# Patient Record
Sex: Female | Born: 1950
Health system: Southern US, Community
[De-identification: ages and names within clinical notes are randomized; demographics above are authoritative.]

## PROBLEM LIST (undated history)

## (undated) DIAGNOSIS — H8109 Meniere's disease, unspecified ear: Secondary | ICD-10-CM

## (undated) DIAGNOSIS — I10 Essential (primary) hypertension: Secondary | ICD-10-CM

## (undated) DIAGNOSIS — M1990 Unspecified osteoarthritis, unspecified site: Secondary | ICD-10-CM

## (undated) DIAGNOSIS — J302 Other seasonal allergic rhinitis: Secondary | ICD-10-CM

## (undated) DIAGNOSIS — F17200 Nicotine dependence, unspecified, uncomplicated: Secondary | ICD-10-CM

## (undated) HISTORY — PX: CARPAL TUNNEL RELEASE: SHX101

## (undated) HISTORY — PX: MIDDLE EAR SURGERY: SHX713

## (undated) HISTORY — PX: TRIGGER FINGER RELEASE: SHX641

## (undated) HISTORY — PX: TUBAL LIGATION: SHX77

## (undated) HISTORY — PX: ABDOMINAL HYSTERECTOMY: SHX81

---

## 1999-08-19 ENCOUNTER — Other Ambulatory Visit: Admission: RE | Admit: 1999-08-19 | Discharge: 1999-08-19 | Payer: Self-pay | Admitting: Obstetrics and Gynecology

## 1999-09-30 ENCOUNTER — Encounter: Admission: RE | Admit: 1999-09-30 | Discharge: 1999-09-30 | Payer: Self-pay | Admitting: Obstetrics and Gynecology

## 2000-10-03 ENCOUNTER — Encounter: Admission: RE | Admit: 2000-10-03 | Discharge: 2000-10-03 | Payer: Self-pay | Admitting: Obstetrics and Gynecology

## 2000-10-03 ENCOUNTER — Encounter: Payer: Self-pay | Admitting: Obstetrics and Gynecology

## 2001-07-04 ENCOUNTER — Other Ambulatory Visit: Admission: RE | Admit: 2001-07-04 | Discharge: 2001-07-04 | Payer: Self-pay | Admitting: Obstetrics and Gynecology

## 2001-07-13 ENCOUNTER — Encounter (INDEPENDENT_AMBULATORY_CARE_PROVIDER_SITE_OTHER): Payer: Self-pay

## 2001-07-13 ENCOUNTER — Other Ambulatory Visit: Admission: RE | Admit: 2001-07-13 | Discharge: 2001-07-13 | Payer: Self-pay | Admitting: Obstetrics and Gynecology

## 2001-10-04 ENCOUNTER — Encounter: Payer: Self-pay | Admitting: Obstetrics and Gynecology

## 2001-10-04 ENCOUNTER — Encounter: Admission: RE | Admit: 2001-10-04 | Discharge: 2001-10-04 | Payer: Self-pay | Admitting: Obstetrics and Gynecology

## 2002-10-17 ENCOUNTER — Encounter: Payer: Self-pay | Admitting: Obstetrics and Gynecology

## 2002-10-17 ENCOUNTER — Encounter: Admission: RE | Admit: 2002-10-17 | Discharge: 2002-10-17 | Payer: Self-pay | Admitting: Obstetrics and Gynecology

## 2003-11-19 ENCOUNTER — Encounter: Admission: RE | Admit: 2003-11-19 | Discharge: 2003-11-19 | Payer: Self-pay | Admitting: Obstetrics and Gynecology

## 2004-12-16 ENCOUNTER — Encounter: Admission: RE | Admit: 2004-12-16 | Discharge: 2004-12-16 | Payer: Self-pay | Admitting: Obstetrics and Gynecology

## 2005-01-06 ENCOUNTER — Encounter: Admission: RE | Admit: 2005-01-06 | Discharge: 2005-01-06 | Payer: Self-pay | Admitting: Obstetrics and Gynecology

## 2005-11-02 ENCOUNTER — Encounter: Admission: RE | Admit: 2005-11-02 | Discharge: 2005-11-02 | Payer: Self-pay | Admitting: Specialist

## 2006-01-19 ENCOUNTER — Encounter: Admission: RE | Admit: 2006-01-19 | Discharge: 2006-01-19 | Payer: Self-pay | Admitting: Family Medicine

## 2007-03-01 ENCOUNTER — Encounter: Admission: RE | Admit: 2007-03-01 | Discharge: 2007-03-01 | Payer: Self-pay | Admitting: Otolaryngology

## 2007-08-30 ENCOUNTER — Ambulatory Visit (HOSPITAL_COMMUNITY): Admission: RE | Admit: 2007-08-30 | Discharge: 2007-08-30 | Payer: Self-pay | Admitting: Obstetrics and Gynecology

## 2007-08-30 ENCOUNTER — Encounter (INDEPENDENT_AMBULATORY_CARE_PROVIDER_SITE_OTHER): Payer: Self-pay | Admitting: Obstetrics and Gynecology

## 2009-06-09 ENCOUNTER — Encounter: Admission: RE | Admit: 2009-06-09 | Discharge: 2009-06-09 | Payer: Self-pay | Admitting: Obstetrics and Gynecology

## 2009-10-22 ENCOUNTER — Encounter (INDEPENDENT_AMBULATORY_CARE_PROVIDER_SITE_OTHER): Payer: Self-pay | Admitting: Obstetrics and Gynecology

## 2009-10-22 ENCOUNTER — Inpatient Hospital Stay (HOSPITAL_COMMUNITY): Admission: RE | Admit: 2009-10-22 | Discharge: 2009-10-23 | Payer: Self-pay | Admitting: Obstetrics and Gynecology

## 2009-11-07 ENCOUNTER — Ambulatory Visit (HOSPITAL_COMMUNITY): Admission: RE | Admit: 2009-11-07 | Discharge: 2009-11-07 | Payer: Self-pay | Admitting: Otolaryngology

## 2009-11-07 ENCOUNTER — Encounter (INDEPENDENT_AMBULATORY_CARE_PROVIDER_SITE_OTHER): Payer: Self-pay | Admitting: Otolaryngology

## 2011-01-09 ENCOUNTER — Encounter: Payer: Self-pay | Admitting: Obstetrics and Gynecology

## 2011-02-12 IMAGING — CR DG CHEST 2V
2 series · 2 of 2 positions shown · non-contrast
Comparison: None

CLINICAL DATA: Pre admission for Meniere's disease.  Hypertension
controlled medically.  31 years smoking history.  No other chest
complaints

CHEST - 2 VIEW

[view not recorded (1 of 2)]
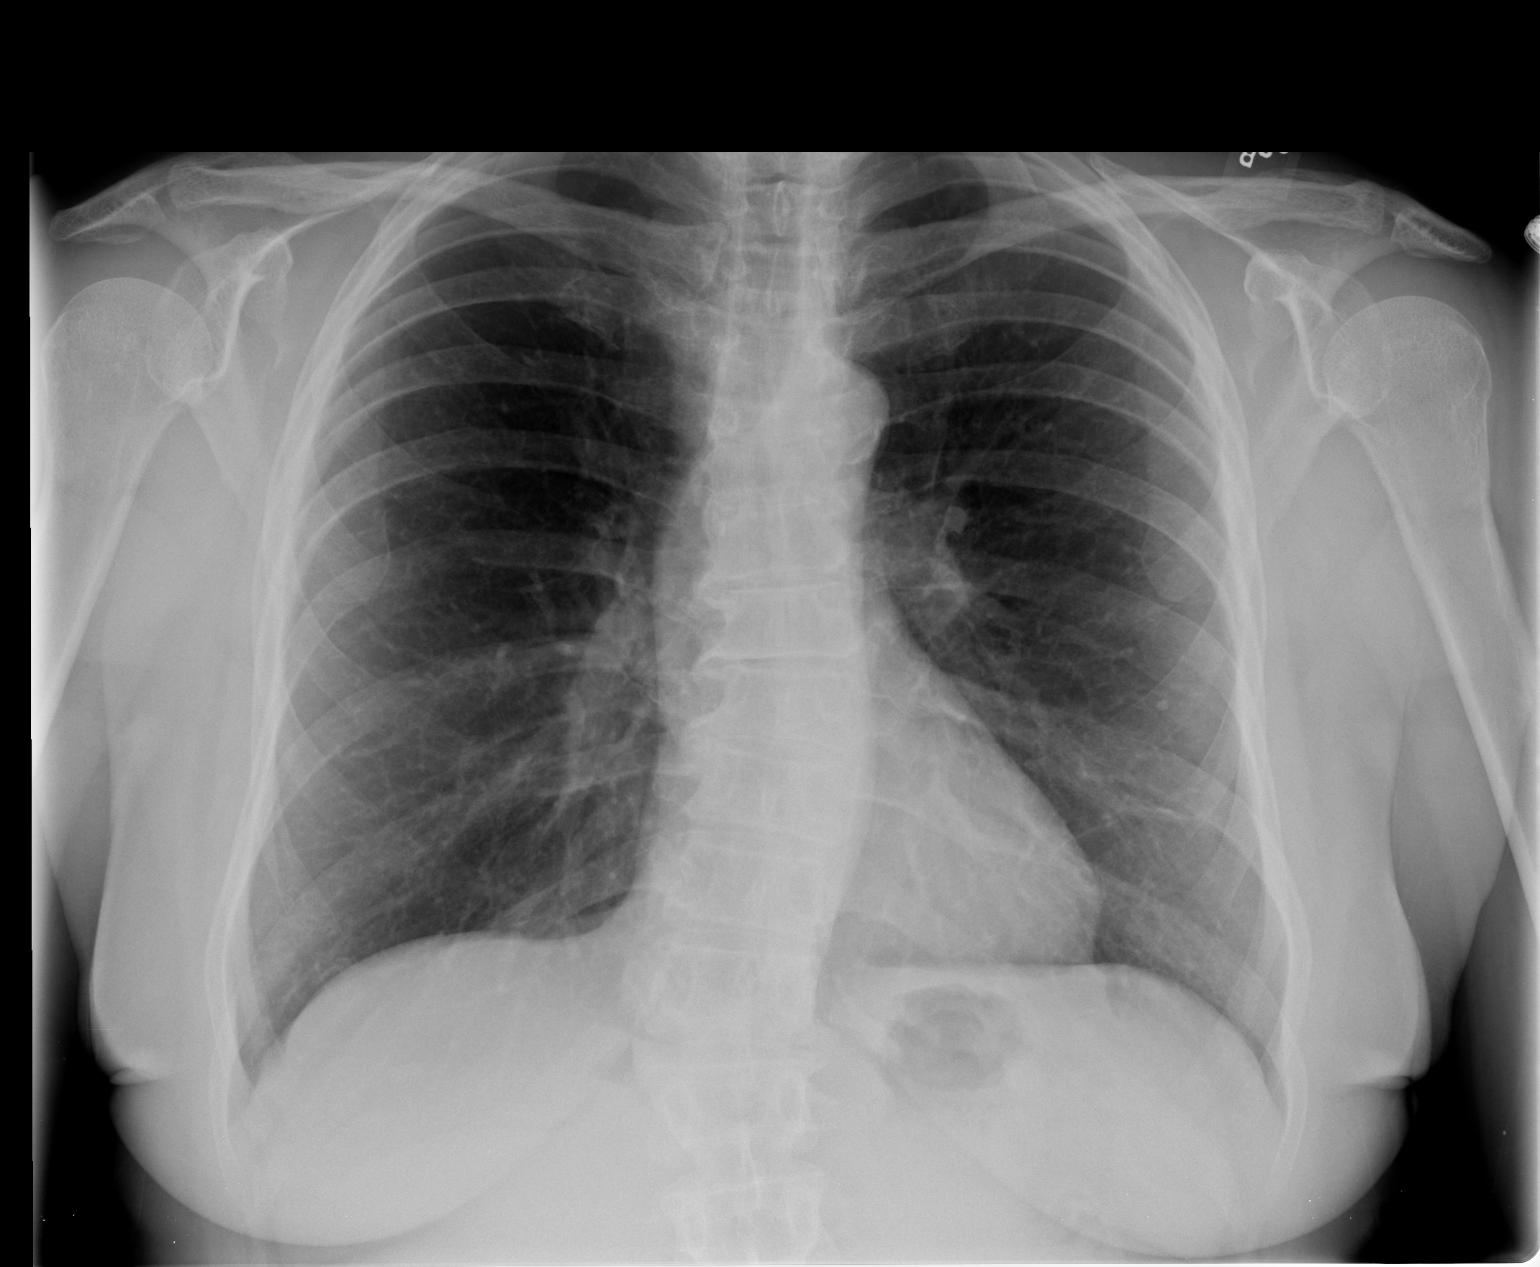

[view not recorded (2 of 2)]
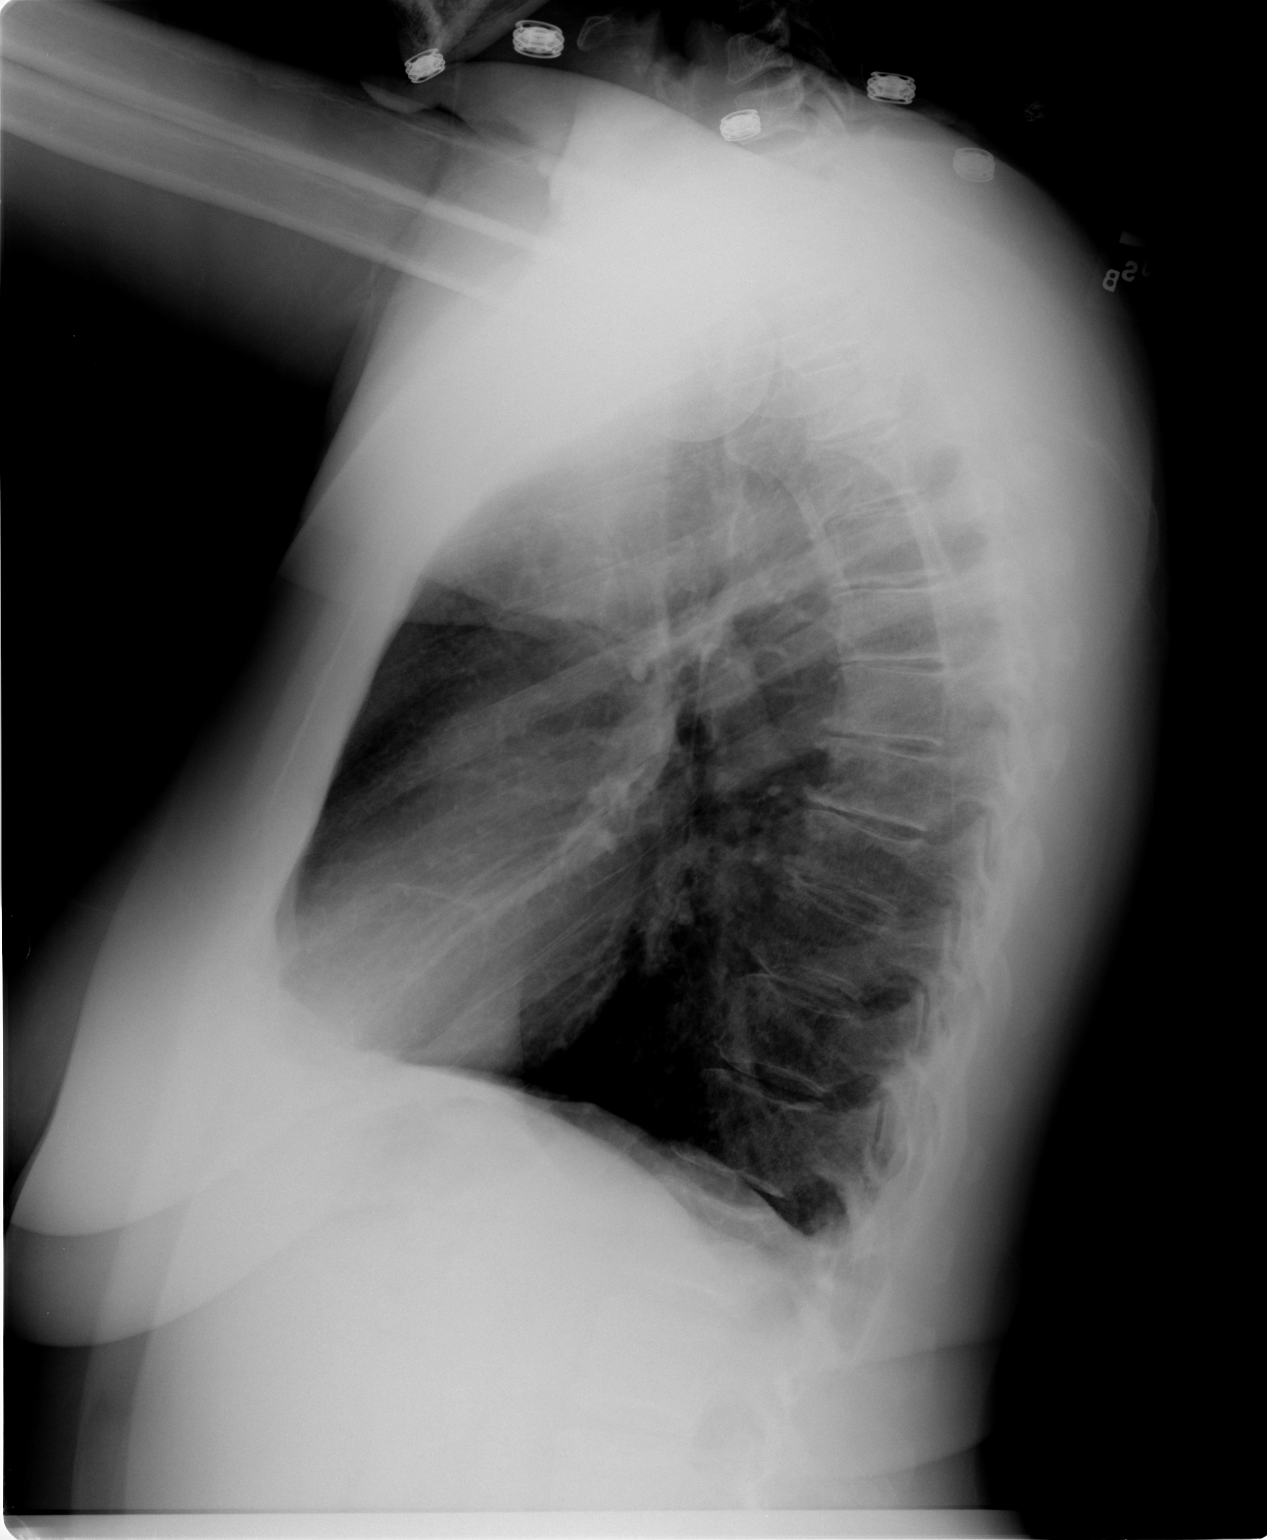

[2 of 2 positions shown; findings below may reference images not displayed]

FINDINGS: Heart and mediastinal contours are within normal limits.
There is thoracic scoliosis identified convex left.  The lung
fields are clear with no evidence for focal infiltrate or
congestive failure. No pleural fluid or peribronchial cuffing is
noted. Bony structures demonstrate degenerative changes of the mid
thoracic spine with osteophytosis and are otherwise intact.
IMPRESSION: No acute cardiopulmonary abnormality.

## 2011-03-24 LAB — CBC
HCT: 38.9 % (ref 36.0–46.0)
Hemoglobin: 13.5 g/dL (ref 12.0–15.0)
MCHC: 33.8 g/dL (ref 30.0–36.0)
MCHC: 34.1 g/dL (ref 30.0–36.0)
MCHC: 34.6 g/dL (ref 30.0–36.0)
MCV: 90.4 fL (ref 78.0–100.0)
MCV: 90.7 fL (ref 78.0–100.0)
MCV: 90.2 fL (ref 78.0–100.0)
Platelets: 238 10*3/uL (ref 150–400)
Platelets: 309 K/uL (ref 150–400)
RBC: 3.48 MIL/uL — ABNORMAL LOW (ref 3.87–5.11)
RBC: 4.31 MIL/uL (ref 3.87–5.11)
RDW: 12.3 % (ref 11.5–15.5)
RDW: 12.6 % (ref 11.5–15.5)
WBC: 12.8 10*3/uL — ABNORMAL HIGH (ref 4.0–10.5)

## 2011-03-24 LAB — URINALYSIS, ROUTINE W REFLEX MICROSCOPIC
Bilirubin Urine: NEGATIVE
Glucose, UA: NEGATIVE mg/dL
Ketones, ur: NEGATIVE mg/dL
Nitrite: NEGATIVE
Protein, ur: NEGATIVE mg/dL
Specific Gravity, Urine: 1.011 (ref 1.005–1.030)
Urobilinogen, UA: 0.2 mg/dL (ref 0.0–1.0)
pH: 7 (ref 5.0–8.0)

## 2011-03-24 LAB — BASIC METABOLIC PANEL WITH GFR
CO2: 27 meq/L (ref 19–32)
Calcium: 9.6 mg/dL (ref 8.4–10.5)
Creatinine, Ser: 0.87 mg/dL (ref 0.4–1.2)
GFR calc non Af Amer: >60 mL/min (ref >60)
Glucose, Bld: 94 mg/dL (ref 70–99)
Sodium: 136 meq/L (ref 135–145)

## 2011-03-24 LAB — COMPREHENSIVE METABOLIC PANEL
AST: 17 U/L (ref 0–37)
CO2: 28 mEq/L (ref 19–32)
Calcium: 8.4 mg/dL (ref 8.4–10.5)
Creatinine, Ser: 0.94 mg/dL (ref 0.4–1.2)
GFR calc Af Amer: >60 mL/min (ref >60)
GFR calc non Af Amer: >60 mL/min (ref >60)

## 2011-03-24 LAB — URINE MICROSCOPIC-ADD ON

## 2011-03-24 LAB — BASIC METABOLIC PANEL
BUN: 14 mg/dL (ref 6–23)
Chloride: 102 mEq/L (ref 96–112)
GFR calc Af Amer: >60 mL/min (ref >60)
Potassium: 4.1 mEq/L (ref 3.5–5.1)

## 2011-03-24 LAB — CREATININE, SERUM: GFR calc Af Amer: >60 mL/min (ref >60)

## 2011-05-04 NOTE — Op Note (Signed)
NAMENAOME, BRIGANDI                ACCOUNT NO.:  [id number]   MEDICAL RECORD NO.:  [id number]          PATIENT TYPE:  AMB   LOCATION:  [facility]C                           FACILITY:  [facility]   PHYSICIAN:  Carrington Clamp, M.D. DATE OF BIRTH:  1951/04/25   DATE OF PROCEDURE:  08/30/2007  DATE OF DISCHARGE:                               OPERATIVE REPORT   PREOPERATIVE DIAGNOSIS:  Post menopausal bleeding.   POSTOPERATIVE DIAGNOSIS:  Post menopausal bleeding.   PROCEDURE:  Dilation and curettage with hysteroscopy.   SURGEON:  Carrington Clamp, M.D.   ASSISTANT:  None.   ANESTHESIA:  LMA.   FINDINGS:  Shaggy endometrium with a T'd cavity of the uterus,  otherwise, no other abnormality seen.   SPECIMENS:  Uterine curettings to pathology.   ESTIMATED BLOOD LOSS:  Minimal.   FLUIDS REPLACED:  1000 mL.   URINE OUTPUT:  Not measured.   COMPLICATIONS:  None.   COUNTS:  Correct x 3.   SURGICAL TECHNIQUE:  After adequate LMA anesthesia was achieved, the  patient was prepped and draped in the usual sterile fashion in the  dorsal lithotomy position.  The bladder was emptied with a catheter and  speculum placed in the vagina.  The single tooth tenaculum was used to  stabilize the cervix and the cervix was dilated with Shawnie Pons dilators.  The hysteroscope was passed into the uterine cavity and the above  findings noted.  Alternating sharp curettage with a look with the  hysteroscope was then performed.  Tissue was sent to pathology, although  it was minimal.  All instruments were withdrawn from the vagina.  Hysteroscopy fluid deficit was less than 100 mL.     Carrington Clamp, M.D.  Electronically Signed    MH/MEDQ  D:  08/30/2007  T:  08/30/2007  Job:  [id number]

## 2011-10-01 LAB — BASIC METABOLIC PANEL
Calcium: 9.8
Creatinine, Ser: 0.97
GFR calc non Af Amer: 60 — ABNORMAL LOW
Glucose, Bld: 116 — ABNORMAL HIGH
Sodium: 137

## 2011-10-01 LAB — CBC
HCT: 42.5
Hemoglobin: 14.8
WBC: 15.4 — ABNORMAL HIGH

## 2011-10-01 LAB — URINE MICROSCOPIC-ADD ON

## 2011-10-01 LAB — URINALYSIS, ROUTINE W REFLEX MICROSCOPIC
Bilirubin Urine: NEGATIVE
Hgb urine dipstick: NEGATIVE
Nitrite: NEGATIVE
Specific Gravity, Urine: 1.01
pH: 7

## 2012-01-19 ENCOUNTER — Other Ambulatory Visit: Payer: Self-pay | Admitting: Orthopedic Surgery

## 2012-01-19 DIAGNOSIS — M545 Low back pain: Secondary | ICD-10-CM

## 2012-01-21 ENCOUNTER — Ambulatory Visit
Admission: RE | Admit: 2012-01-21 | Discharge: 2012-01-21 | Disposition: A | Discharge status: Home / Self Care | Payer: BC Managed Care – PPO | Source: Ambulatory Visit | Attending: Orthopedic Surgery | Admitting: Orthopedic Surgery

## 2012-01-21 DIAGNOSIS — M545 Low back pain: Secondary | ICD-10-CM

## 2012-01-23 ENCOUNTER — Other Ambulatory Visit: Payer: Self-pay

## 2013-02-19 ENCOUNTER — Ambulatory Visit (HOSPITAL_COMMUNITY)
Admission: RE | Admit: 2013-02-19 | Discharge: 2013-02-19 | Disposition: A | Discharge status: Home / Self Care | Payer: BC Managed Care – PPO | Source: Ambulatory Visit | Attending: Orthopedic Surgery | Admitting: Orthopedic Surgery

## 2013-02-19 ENCOUNTER — Other Ambulatory Visit (HOSPITAL_COMMUNITY): Payer: Self-pay | Admitting: Orthopedic Surgery

## 2013-02-19 DIAGNOSIS — I82409 Acute embolism and thrombosis of unspecified deep veins of unspecified lower extremity: Secondary | ICD-10-CM | POA: Insufficient documentation

## 2013-02-19 DIAGNOSIS — I82402 Acute embolism and thrombosis of unspecified deep veins of left lower extremity: Secondary | ICD-10-CM

## 2013-02-19 NOTE — Progress Notes (Signed)
Left Leg Venous Duplex Completed. Negative for DVT and Baker's Cyst. Julie Rogers

## 2014-03-08 ENCOUNTER — Other Ambulatory Visit: Payer: Self-pay | Admitting: Obstetrics and Gynecology

## 2016-11-29 DIAGNOSIS — M170 Bilateral primary osteoarthritis of knee: Secondary | ICD-10-CM | POA: Diagnosis not present

## 2016-12-27 DIAGNOSIS — G5602 Carpal tunnel syndrome, left upper limb: Secondary | ICD-10-CM | POA: Diagnosis not present

## 2017-01-03 ENCOUNTER — Ambulatory Visit: Payer: Self-pay | Admitting: Orthopedic Surgery

## 2017-01-12 DIAGNOSIS — M1711 Unilateral primary osteoarthritis, right knee: Secondary | ICD-10-CM | POA: Diagnosis not present

## 2017-01-12 DIAGNOSIS — M1712 Unilateral primary osteoarthritis, left knee: Secondary | ICD-10-CM | POA: Diagnosis not present

## 2017-02-07 ENCOUNTER — Encounter (HOSPITAL_BASED_OUTPATIENT_CLINIC_OR_DEPARTMENT_OTHER): Payer: Self-pay | Admitting: *Deleted

## 2017-02-08 ENCOUNTER — Encounter (HOSPITAL_BASED_OUTPATIENT_CLINIC_OR_DEPARTMENT_OTHER)
Admission: RE | Admit: 2017-02-08 | Discharge: 2017-02-08 | Disposition: A | Discharge status: Home / Self Care | Payer: PPO | Source: Ambulatory Visit | Attending: Orthopedic Surgery | Admitting: Orthopedic Surgery

## 2017-02-08 DIAGNOSIS — I10 Essential (primary) hypertension: Secondary | ICD-10-CM | POA: Diagnosis not present

## 2017-02-08 DIAGNOSIS — F17210 Nicotine dependence, cigarettes, uncomplicated: Secondary | ICD-10-CM | POA: Diagnosis not present

## 2017-02-08 DIAGNOSIS — Z79899 Other long term (current) drug therapy: Secondary | ICD-10-CM | POA: Diagnosis not present

## 2017-02-08 DIAGNOSIS — G5602 Carpal tunnel syndrome, left upper limb: Secondary | ICD-10-CM | POA: Diagnosis not present

## 2017-02-08 DIAGNOSIS — H8109 Meniere's disease, unspecified ear: Secondary | ICD-10-CM | POA: Diagnosis not present

## 2017-02-08 DIAGNOSIS — M1990 Unspecified osteoarthritis, unspecified site: Secondary | ICD-10-CM | POA: Diagnosis not present

## 2017-02-08 LAB — BASIC METABOLIC PANEL
Anion gap: 13 (ref 5–15)
BUN: 16 mg/dL (ref 6–20)
CHLORIDE: 100 mmol/L — AB (ref 101–111)
CO2: 25 mmol/L (ref 22–32)
CREATININE: 0.93 mg/dL (ref 0.44–1.00)
Calcium: 9.6 mg/dL (ref 8.9–10.3)
GFR calc non Af Amer: >60 mL/min (ref >60)
Glucose, Bld: 118 mg/dL — ABNORMAL HIGH (ref 65–99)
POTASSIUM: 4.7 mmol/L (ref 3.5–5.1)
SODIUM: 138 mmol/L (ref 135–145)

## 2017-02-11 ENCOUNTER — Ambulatory Visit (HOSPITAL_BASED_OUTPATIENT_CLINIC_OR_DEPARTMENT_OTHER)
Admission: RE | Admit: 2017-02-11 | Discharge: 2017-02-11 | Disposition: A | Discharge status: Home / Self Care | Payer: PPO | Source: Ambulatory Visit | Attending: Orthopedic Surgery | Admitting: Orthopedic Surgery

## 2017-02-11 ENCOUNTER — Ambulatory Visit (HOSPITAL_BASED_OUTPATIENT_CLINIC_OR_DEPARTMENT_OTHER): Payer: PPO | Admitting: Anesthesiology

## 2017-02-11 ENCOUNTER — Encounter (HOSPITAL_BASED_OUTPATIENT_CLINIC_OR_DEPARTMENT_OTHER): Payer: Self-pay

## 2017-02-11 ENCOUNTER — Encounter (HOSPITAL_BASED_OUTPATIENT_CLINIC_OR_DEPARTMENT_OTHER)
Admission: RE | Disposition: A | Discharge status: Home / Self Care | Payer: Self-pay | Source: Ambulatory Visit | Attending: Orthopedic Surgery

## 2017-02-11 DIAGNOSIS — F17210 Nicotine dependence, cigarettes, uncomplicated: Secondary | ICD-10-CM | POA: Diagnosis not present

## 2017-02-11 DIAGNOSIS — G5602 Carpal tunnel syndrome, left upper limb: Secondary | ICD-10-CM | POA: Diagnosis not present

## 2017-02-11 DIAGNOSIS — Z79899 Other long term (current) drug therapy: Secondary | ICD-10-CM | POA: Diagnosis not present

## 2017-02-11 DIAGNOSIS — H8109 Meniere's disease, unspecified ear: Secondary | ICD-10-CM | POA: Insufficient documentation

## 2017-02-11 DIAGNOSIS — M1990 Unspecified osteoarthritis, unspecified site: Secondary | ICD-10-CM | POA: Diagnosis not present

## 2017-02-11 DIAGNOSIS — I10 Essential (primary) hypertension: Secondary | ICD-10-CM | POA: Insufficient documentation

## 2017-02-11 HISTORY — PX: CARPAL TUNNEL RELEASE: SHX101

## 2017-02-11 HISTORY — DX: Meniere's disease, unspecified ear: H81.09

## 2017-02-11 HISTORY — DX: Unspecified osteoarthritis, unspecified site: M19.90

## 2017-02-11 HISTORY — DX: Other seasonal allergic rhinitis: J30.2

## 2017-02-11 HISTORY — DX: Nicotine dependence, unspecified, uncomplicated: F17.200

## 2017-02-11 HISTORY — DX: Essential (primary) hypertension: I10

## 2017-02-11 SURGERY — CARPAL TUNNEL RELEASE
Anesthesia: Monitor Anesthesia Care | Site: Wrist | Laterality: Left

## 2017-02-11 MED ORDER — SCOPOLAMINE 1 MG/3DAYS TD PT72
1.0000 | MEDICATED_PATCH | Freq: Once | TRANSDERMAL | Status: DC | PRN
Start: 2017-02-11 — End: 2017-02-11

## 2017-02-11 MED ORDER — LACTATED RINGERS IV SOLN
INTRAVENOUS | Status: DC
  Administered 2017-02-11: 08:00:00 via INTRAVENOUS

## 2017-02-11 MED ORDER — ONDANSETRON HCL 4 MG/2ML IJ SOLN
INTRAMUSCULAR | Status: AC
Start: 2017-02-11 — End: 2017-02-11
  Filled 2017-02-11: qty 2

## 2017-02-11 MED ORDER — LIDOCAINE HCL (PF) 1 % IJ SOLN
INTRAMUSCULAR | Status: AC
  Filled 2017-02-11: qty 30

## 2017-02-11 MED ORDER — LIDOCAINE 2% (20 MG/ML) 5 ML SYRINGE
INTRAMUSCULAR | Status: AC
  Filled 2017-02-11: qty 5

## 2017-02-11 MED ORDER — FENTANYL CITRATE (PF) 100 MCG/2ML IJ SOLN
INTRAMUSCULAR | Status: AC
  Filled 2017-02-11: qty 2

## 2017-02-11 MED ORDER — FENTANYL CITRATE (PF) 100 MCG/2ML IJ SOLN
50.0000 ug | INTRAMUSCULAR | Status: DC | PRN
  Administered 2017-02-11: 50 ug via INTRAVENOUS

## 2017-02-11 MED ORDER — BUPIVACAINE HCL (PF) 0.25 % IJ SOLN
INTRAMUSCULAR | Status: AC
  Filled 2017-02-11: qty 30

## 2017-02-11 MED ORDER — CEFAZOLIN SODIUM-DEXTROSE 2-4 GM/100ML-% IV SOLN
2.0000 g | INTRAVENOUS | Status: AC
  Administered 2017-02-11: 2 g via INTRAVENOUS

## 2017-02-11 MED ORDER — MIDAZOLAM HCL 2 MG/2ML IJ SOLN
INTRAMUSCULAR | Status: AC
  Filled 2017-02-11: qty 2

## 2017-02-11 MED ORDER — PROMETHAZINE HCL 25 MG/ML IJ SOLN: 6.2500 mg | INTRAMUSCULAR | Status: DC | PRN

## 2017-02-11 MED ORDER — SODIUM BICARBONATE 4 % IV SOLN
INTRAVENOUS | Status: AC
  Filled 2017-02-11: qty 5

## 2017-02-11 MED ORDER — CHLORHEXIDINE GLUCONATE 4 % EX LIQD
60.0000 mL | Freq: Once | CUTANEOUS | Status: DC
Start: 2017-02-11 — End: 2017-02-11

## 2017-02-11 MED ORDER — CEFAZOLIN SODIUM-DEXTROSE 2-4 GM/100ML-% IV SOLN
INTRAVENOUS | Status: AC
  Filled 2017-02-11: qty 100

## 2017-02-11 MED ORDER — SODIUM BICARBONATE 4 % IV SOLN: INTRAVENOUS | Status: DC | PRN

## 2017-02-11 MED ORDER — BUPIVACAINE HCL (PF) 0.25 % IJ SOLN
INTRAMUSCULAR | Status: DC | PRN
  Administered 2017-02-11: 10 mL

## 2017-02-11 MED ORDER — HYDROCODONE-ACETAMINOPHEN 5-325 MG PO TABS: 2.0000 | ORAL_TABLET | Freq: Four times a day (QID) | ORAL | 0 refills | Status: DC | PRN

## 2017-02-11 MED ORDER — ONDANSETRON HCL 4 MG/2ML IJ SOLN
INTRAMUSCULAR | Status: DC | PRN
  Administered 2017-02-11: 4 mg via INTRAVENOUS

## 2017-02-11 MED ORDER — MIDAZOLAM HCL 2 MG/2ML IJ SOLN
1.0000 mg | INTRAMUSCULAR | Status: DC | PRN
  Administered 2017-02-11: 1 mg via INTRAVENOUS

## 2017-02-11 MED ORDER — FENTANYL CITRATE (PF) 100 MCG/2ML IJ SOLN: 25.0000 ug | INTRAMUSCULAR | Status: DC | PRN

## 2017-02-11 MED ORDER — PROPOFOL 10 MG/ML IV BOLUS
INTRAVENOUS | Status: AC
  Filled 2017-02-11: qty 20

## 2017-02-11 SURGICAL SUPPLY — 45 items
BANDAGE ACE 3X5.8 VEL STRL LF (GAUZE/BANDAGES/DRESSINGS) ×2 IMPLANT
BLADE CARPAL TUNNEL SNGL USE (BLADE) ×2 IMPLANT
BLADE SURG 15 STRL LF DISP TIS (BLADE) ×2 IMPLANT
BLADE SURG 15 STRL SS (BLADE) ×4
BNDG CONFORM 3 STRL LF (GAUZE/BANDAGES/DRESSINGS) ×2 IMPLANT
BRUSH SCRUB EZ PLAIN DRY (MISCELLANEOUS) ×2 IMPLANT
CORDS BIPOLAR (ELECTRODE) ×2 IMPLANT
COVER BACK TABLE 60X90IN (DRAPES) ×2 IMPLANT
CUFF TOURNIQUET SINGLE 18IN (TOURNIQUET CUFF) IMPLANT
DRAIN PENROSE 1/4X12 LTX STRL (WOUND CARE) IMPLANT
DRAPE EXTREMITY T 121X128X90 (DRAPE) ×2 IMPLANT
DRAPE SURG 17X23 STRL (DRAPES) ×2 IMPLANT
DRSG EMULSION OIL 3X3 NADH (GAUZE/BANDAGES/DRESSINGS) ×2 IMPLANT
GAUZE SPONGE 4X4 12PLY STRL (GAUZE/BANDAGES/DRESSINGS) IMPLANT
GAUZE SPONGE 4X4 16PLY XRAY LF (GAUZE/BANDAGES/DRESSINGS) IMPLANT
GAUZE XEROFORM 1X8 LF (GAUZE/BANDAGES/DRESSINGS) ×2 IMPLANT
GLOVE BIOGEL M STRL SZ7.5 (GLOVE) IMPLANT
GLOVE SS BIOGEL STRL SZ 8 (GLOVE) ×1 IMPLANT
GLOVE SUPERSENSE BIOGEL SZ 8 (GLOVE) ×1
GOWN STRL REUS W/ TWL LRG LVL3 (GOWN DISPOSABLE) ×1 IMPLANT
GOWN STRL REUS W/ TWL XL LVL3 (GOWN DISPOSABLE) ×1 IMPLANT
GOWN STRL REUS W/TWL LRG LVL3 (GOWN DISPOSABLE) ×2
GOWN STRL REUS W/TWL XL LVL3 (GOWN DISPOSABLE) ×2
LOOP VESSEL MAXI BLUE (MISCELLANEOUS) IMPLANT
NDL HYPO 25X1 1.5 SAFETY (NEEDLE) ×2 IMPLANT
NDL SAFETY ECLIPSE 18X1.5 (NEEDLE) ×1 IMPLANT
NEEDLE HYPO 18GX1.5 SHARP (NEEDLE) ×2
NEEDLE HYPO 22GX1.5 SAFETY (NEEDLE) IMPLANT
NEEDLE HYPO 25X1 1.5 SAFETY (NEEDLE) ×4 IMPLANT
NS IRRIG 1000ML POUR BTL (IV SOLUTION) ×2 IMPLANT
PACK BASIN DAY SURGERY FS (CUSTOM PROCEDURE TRAY) ×2 IMPLANT
PAD ALCOHOL SWAB (MISCELLANEOUS) ×16 IMPLANT
PAD CAST 3X4 CTTN HI CHSV (CAST SUPPLIES) ×2 IMPLANT
PADDING CAST ABS 4INX4YD NS (CAST SUPPLIES) ×1
PADDING CAST ABS COTTON 4X4 ST (CAST SUPPLIES) ×1 IMPLANT
PADDING CAST COTTON 3X4 STRL (CAST SUPPLIES) ×4
SHEET MEDIUM DRAPE 40X70 STRL (DRAPES) ×2 IMPLANT
STOCKINETTE 4X48 STRL (DRAPES) ×2 IMPLANT
SUT PROLENE 4 0 PS 2 18 (SUTURE) ×2 IMPLANT
SYR BULB 3OZ (MISCELLANEOUS) ×2 IMPLANT
SYR CONTROL 10ML LL (SYRINGE) ×4 IMPLANT
TOWEL OR 17X24 6PK STRL BLUE (TOWEL DISPOSABLE) ×2 IMPLANT
TOWEL OR NON WOVEN STRL DISP B (DISPOSABLE) ×2 IMPLANT
TRAY DSU PREP LF (CUSTOM PROCEDURE TRAY) ×2 IMPLANT
UNDERPAD 30X30 (UNDERPADS AND DIAPERS) ×2 IMPLANT

## 2017-02-11 NOTE — H&P (Signed)
Julie Rogers is an 66 y.o. female.   Chief Complaint: L CTS HPI: Patient presents for evaluation and treatment of the of their upper extremity predicament. The patient denies neck, back, chest or  abdominal pain. The patient notes that they have no lower extremity problems. The patients primary complaint is noted. We are planning surgical care pathway for the upper extremity.  Past Medical History:  Diagnosis Date  . Arthritis   . Hypertension   . Meniere disease    takes maxide  . Seasonal allergies   . Smoker     Past Surgical History:  Procedure Laterality Date  . ABDOMINAL HYSTERECTOMY    . CARPAL TUNNEL RELEASE Right   . MIDDLE EAR SURGERY Right    Dr Ernesto Rutherford  . TRIGGER FINGER RELEASE Right   . TUBAL LIGATION      History reviewed. No pertinent family history. Social History:  reports that she has been smoking.  She has been smoking about 0.50 packs per day. She has never used smokeless tobacco. She reports that she drinks alcohol. Her drug history is not on file.  Allergies: No Known Allergies  Medications Prior to Admission  Medication Sig Dispense Refill  . Cholecalciferol (VITAMIN D3) 2000 units TABS Take by mouth.    . estradiol (VIVELLE-DOT) 0.1 MG/24HR patch Place 1 patch onto the skin 2 (two) times a week.    . fexofenadine (ALLEGRA) 180 MG tablet Take 180 mg by mouth daily.    Marland Kitchen losartan (COZAAR) 25 MG tablet Take 25 mg by mouth daily.    Marland Kitchen triamterene-hydrochlorothiazide (MAXZIDE-25) 37.5-25 MG tablet Take 1 tablet by mouth daily.      No results found for this or any previous visit (from the past 48 hour(s)). No results found.  Review of Systems  Respiratory: Negative.   Cardiovascular: Negative.   Gastrointestinal: Negative.   Genitourinary: Negative.     Blood pressure (!) 155/80, pulse 65, temperature 97.9 F (36.6 C), temperature source Oral, resp. rate 20, height 5' 1.5" (1.562 m), weight 87.2 kg (192 lb 4 oz), SpO2 98 %. Physical Exam  L  CTS with positive tinels and phalens test  The patient is alert and oriented in no acute distress. The patient complains of pain in the affected upper extremity.  The patient is noted to have a normal HEENT exam. Lung fields show equal chest expansion and no shortness of breath. Abdomen exam is nontender without distention. Lower extremity examination does not show any fracture dislocation or blood clot symptoms. Pelvis is stable and the neck and back are stable and nontender. Assessment/Plan We are planning surgery for your upper extremity. The risk and benefits of surgery to include risk of bleeding, infection, anesthesia,  damage to normal structures and failure of the surgery to accomplish its intended goals of relieving symptoms and restoring function have been discussed in detail. With this in mind we plan to proceed. I have specifically discussed with the patient the pre-and postoperative regime and the dos and don'ts and risk and benefits in great detail. Risk and benefits of surgery also include risk of dystrophy(CRPS), chronic nerve pain, failure of the healing process to go onto completion and other inherent risks of surgery The relavent the pathophysiology of the disease/injury process, as well as the alternatives for treatment and postoperative course of action has been discussed in great detail with the patient who desires to proceed.  We will do everything in our power to help you (the patient) restore function  to the upper extremity. It is a pleasure to see this patient today.   Paulene Floor, MD 02/11/2017, 7:43 AM

## 2017-02-11 NOTE — Anesthesia Preprocedure Evaluation (Addendum)
Anesthesia Evaluation  Patient identified by MRN, date of birth, ID band Patient awake    Reviewed: Allergy & Precautions, NPO status , Patient's Chart, lab work & pertinent test results  Airway Mallampati: II  TM Distance: <3 FB Neck ROM: Full    Dental  (+) Teeth Intact, Dental Advisory Given   Pulmonary Current Smoker,    breath sounds clear to auscultation       Cardiovascular hypertension, Pt. on medications negative cardio ROS   Rhythm:Regular Rate:Normal     Neuro/Psych Meineres dz    GI/Hepatic negative GI ROS, Neg liver ROS,   Endo/Other  negative endocrine ROS  Renal/GU negative Renal ROS     Musculoskeletal  (+) Arthritis ,   Abdominal   Peds  Hematology negative hematology ROS (+)   Anesthesia Other Findings Claustrophobia  Reproductive/Obstetrics                           Anesthesia Physical Anesthesia Plan  ASA: II  Anesthesia Plan: MAC   Post-op Pain Management:    Induction: Intravenous  Airway Management Planned: Natural Airway and Simple Face Mask  Additional Equipment:   Intra-op Plan:   Post-operative Plan:   Informed Consent: I have reviewed the patients History and Physical, chart, labs and discussed the procedure including the risks, benefits and alternatives for the proposed anesthesia with the patient or authorized representative who has indicated his/her understanding and acceptance.     Plan Discussed with: CRNA  Anesthesia Plan Comments:         Anesthesia Quick Evaluation

## 2017-02-11 NOTE — Op Note (Signed)
Julie Rogers, Julie Rogers                ACCOUNT NO.:  [id number]  MEDICAL RECORD NO.:  [id number]  LOCATION:                                 FACILITY:  PHYSICIAN:  Satira Anis. Amedeo Plenty, M.D.     DATE OF BIRTH:  DATE OF PROCEDURE:  02/11/2017 DATE OF DISCHARGE:                              OPERATIVE REPORT   PREOPERATIVE DIAGNOSIS:  Left carpal tunnel syndrome.  POSTOPERATIVE DIAGNOSIS:  Left carpal tunnel syndrome.  PROCEDURES: 1. Left median nerve/peripheral nerve block at the wrist and forearm     level for anesthetic purposes for carpal tunnel release. 2. Left limited open carpal tunnel release.  SURGEON:  Satira Anis. Amedeo Plenty, M.D.  ASSISTANT:  None.  COMPLICATIONS:  None.  ANESTHESIA:  Peripheral nerve block, keeping the patient awake, alert, and oriented the entire case.  TOURNIQUET TIME:  Less than 15 minutes.  INDICATIONS:  Pleasant female who presents for the above-mentioned diagnosis.  I have counseled him in regard to risks and benefits of surgery, and she desires to proceed with the above-mentioned operative intervention.  All questions have been encouraged and answered and addressed.  OPERATIVE PROCEDURE:  The patient was seen by myself and Anesthesia, taken to the operative theater, underwent smooth induction of peripheral nerve/median nerve field block.  She was then prepped and draped in the usual sterile fashion.  I performed a Hibiclens pre-scrub, followed by 10-minute surgical Betadine scrub and paint.  Time-out was called.  Arm was elevated, tourniquet was insufflated to 250 mmHg.  Incision was made 1-1.5 cm at the distal edge of transverse carpal ligament.  This coursed down to access the transverse carpal ligament.  This was released at its distal midpoint origin.  I then dissected distally and confirmed distal release.  There was a fairly thick palmaris muscle which we resected in addition to this stout distal bands.  This was all released  without difficulty.  Superficial palmar arch was carefully protected.  Fat pad egressed nicely indicating correct release.  Following this, distal to proximal dissection was carried out until adequate room was available for canal, prepared toward device 1, 2, and 3, which was placed just under the proximal leading leaflet of transverse carpal ligament.  With the patient awake alert, oriented, we then placed the security clip, obturator disengaged, and security knife was then placed and security clip effectively releasing the proximal leaflet of transverse carpal ligament.  The patient tolerated this well.  There were no complicating features.  She was awake, alert, oriented during all points and passes and had no discomfort.  Following this, we irrigated copiously, closed the wound with Prolene after hemostasis was secured.  All sponge, needle, and instrument counts reported as correct.  She will be monitored in the recovery room and discharged to home on same date of surgery.     Satira Anis. Amedeo Plenty, M.D.   ______________________________ Satira Anis. Amedeo Plenty, M.D.    [facility]  D:  02/11/2017  T:  02/11/2017  Job:  [id number]

## 2017-02-11 NOTE — Op Note (Signed)
See [id number] SP L CTR Cecilia Nishikawa MD

## 2017-02-11 NOTE — Transfer of Care (Signed)
Immediate Anesthesia Transfer of Care Note  Patient: Julie Rogers  Procedure(s) Performed: Procedure(s) with comments: Left Limited open Carpal tunnel release  (Left) - Requests 60 mins  Patient Location: PACU  Anesthesia Type:MAC  Level of Consciousness: awake, alert , oriented and patient cooperative  Airway & Oxygen Therapy: Patient Spontanous Breathing  Post-op Assessment: Report given to RN and Post -op Vital signs reviewed and stable  Post vital signs: Reviewed and stable  Last Vitals:  Vitals:   02/11/17 0655 02/11/17 0835  BP: (!) 155/80   Pulse: 65 66  Resp: 20 19  Temp: 36.6 C     Last Pain:  Vitals:   02/11/17 0655  TempSrc: Oral  PainSc:       Patients Stated Pain Goal: 0 (77/11/65 7903)  Complications: No apparent anesthesia complications

## 2017-02-11 NOTE — Anesthesia Procedure Notes (Signed)
Procedure Name: MAC Date/Time: 02/11/2017 7:56 AM Performed by: Wanita Chamberlain Pre-anesthesia Checklist: Patient identified, Timeout performed, Emergency Drugs available, Suction available and Patient being monitored Patient Re-evaluated:Patient Re-evaluated prior to inductionOxygen Delivery Method: Nasal cannula Intubation Type: IV induction Placement Confirmation: CO2 detector

## 2017-02-11 NOTE — Anesthesia Postprocedure Evaluation (Signed)
Anesthesia Post Note  Patient: Julie Rogers  Procedure(s) Performed: Procedure(s) (LRB): Left Limited open Carpal tunnel release  (Left)  Patient location during evaluation: PACU Anesthesia Type: MAC Level of consciousness: awake and alert Pain management: pain level controlled Vital Signs Assessment: post-procedure vital signs reviewed and stable Respiratory status: spontaneous breathing, nonlabored ventilation, respiratory function stable and patient connected to nasal cannula oxygen Cardiovascular status: stable and blood pressure returned to baseline Anesthetic complications: no       Last Vitals:  Vitals:   02/11/17 0900 02/11/17 0916  BP: (!) 144/60 (!) 163/69  Pulse: (!) 59 73  Resp: 15 18  Temp:  36.4 C    Last Pain:  Vitals:   02/11/17 0916  TempSrc:   PainSc: 0-No pain                 Tiajuana Amass

## 2017-02-11 NOTE — Discharge Instructions (Signed)
We rec Vit B6 200 mg a day to promote nerve recovery Keep bandage clean and dry.  Call for any problems.  No smoking.  Criteria for driving a car: you should be off your pain medicine for 7-8 hours, able to drive one handed(confident), thinking clearly and feeling able in your judgement to drive. Continue elevation as it will decrease swelling.  If instructed by MD move your fingers within the confines of the bandage/splint.  Use ice if instructed by your MD. Call immediately for any sudden loss of feeling in your hand/arm or change in functional abilities of the extremity.We recommend that you to take vitamin C 1000 mg a day to promote healing. We also recommend that if you require  pain medicine that you take a stool softener to prevent constipation as most pain medicines will have constipation side effects. We recommend either Peri-Colace or Senokot and recommend that you also consider adding MiraLAX as well to prevent the constipation affects from pain medicine if you are required to use them. These medicines are over the counter and may be purchased at a local pharmacy. A cup of yogurt and a probiotic can also be helpful during the recovery process as the medicines can disrupt your intestinal environment.   Post Anesthesia Home Care Instructions  Activity: Get plenty of rest for the remainder of the day. A responsible adult should stay with you for 24 hours following the procedure.  For the next 24 hours, DO NOT: -Drive a car -Paediatric nurse -Drink alcoholic beverages -Take any medication unless instructed by your physician -Make any legal decisions or sign important papers.  Meals: Start with liquid foods such as gelatin or soup. Progress to regular foods as tolerated. Avoid greasy, spicy, heavy foods. If nausea and/or vomiting occur, drink only clear liquids until the nausea and/or vomiting subsides. Call your physician if vomiting continues.  Special Instructions/Symptoms: Your throat  may feel dry or sore from the anesthesia or the breathing tube placed in your throat during surgery. If this causes discomfort, gargle with warm salt water. The discomfort should disappear within 24 hours.  If you had a scopolamine patch placed behind your ear for the management of post- operative nausea and/or vomiting:  1. The medication in the patch is effective for 72 hours, after which it should be removed.  Wrap patch in a tissue and discard in the trash. Wash hands thoroughly with soap and water. 2. You may remove the patch earlier than 72 hours if you experience unpleasant side effects which may include dry mouth, dizziness or visual disturbances. 3. Avoid touching the patch. Wash your hands with soap and water after contact with the patch.

## 2017-02-14 ENCOUNTER — Encounter (HOSPITAL_BASED_OUTPATIENT_CLINIC_OR_DEPARTMENT_OTHER): Payer: Self-pay | Admitting: Orthopedic Surgery

## 2017-02-18 DIAGNOSIS — G5602 Carpal tunnel syndrome, left upper limb: Secondary | ICD-10-CM | POA: Diagnosis not present

## 2017-02-25 DIAGNOSIS — G5600 Carpal tunnel syndrome, unspecified upper limb: Secondary | ICD-10-CM | POA: Diagnosis not present

## 2017-05-05 DIAGNOSIS — H2513 Age-related nuclear cataract, bilateral: Secondary | ICD-10-CM | POA: Diagnosis not present

## 2017-05-05 DIAGNOSIS — H52223 Regular astigmatism, bilateral: Secondary | ICD-10-CM | POA: Diagnosis not present

## 2017-05-05 DIAGNOSIS — H1045 Other chronic allergic conjunctivitis: Secondary | ICD-10-CM | POA: Diagnosis not present

## 2017-05-05 DIAGNOSIS — H04203 Unspecified epiphora, bilateral lacrimal glands: Secondary | ICD-10-CM | POA: Diagnosis not present

## 2017-05-05 DIAGNOSIS — H524 Presbyopia: Secondary | ICD-10-CM | POA: Diagnosis not present

## 2017-05-05 DIAGNOSIS — H5203 Hypermetropia, bilateral: Secondary | ICD-10-CM | POA: Diagnosis not present

## 2017-05-17 DIAGNOSIS — E785 Hyperlipidemia, unspecified: Secondary | ICD-10-CM | POA: Diagnosis not present

## 2017-05-17 DIAGNOSIS — R7302 Impaired glucose tolerance (oral): Secondary | ICD-10-CM | POA: Diagnosis not present

## 2017-05-17 DIAGNOSIS — F17200 Nicotine dependence, unspecified, uncomplicated: Secondary | ICD-10-CM | POA: Diagnosis not present

## 2017-05-17 DIAGNOSIS — N183 Chronic kidney disease, stage 3 (moderate): Secondary | ICD-10-CM | POA: Diagnosis not present

## 2017-05-17 DIAGNOSIS — I129 Hypertensive chronic kidney disease with stage 1 through stage 4 chronic kidney disease, or unspecified chronic kidney disease: Secondary | ICD-10-CM | POA: Diagnosis not present

## 2017-05-24 DIAGNOSIS — M1711 Unilateral primary osteoarthritis, right knee: Secondary | ICD-10-CM | POA: Diagnosis not present

## 2017-05-26 DIAGNOSIS — Z1231 Encounter for screening mammogram for malignant neoplasm of breast: Secondary | ICD-10-CM | POA: Diagnosis not present

## 2017-07-05 DIAGNOSIS — H8101 Meniere's disease, right ear: Secondary | ICD-10-CM | POA: Diagnosis not present

## 2017-07-05 DIAGNOSIS — H903 Sensorineural hearing loss, bilateral: Secondary | ICD-10-CM | POA: Diagnosis not present

## 2017-07-05 DIAGNOSIS — H9311 Tinnitus, right ear: Secondary | ICD-10-CM | POA: Diagnosis not present

## 2017-07-08 ENCOUNTER — Telehealth: Payer: Self-pay | Admitting: Hematology

## 2017-07-08 ENCOUNTER — Encounter: Payer: Self-pay | Admitting: Hematology

## 2017-07-08 NOTE — Telephone Encounter (Signed)
Pt has been scheduled to see Dr. Irene Limbo on 8/9 at 1pm. Pt aware to arrive 15 minutes early. Insurance and address verified. Letter mailed to the pt and faxed to the referring.

## 2017-07-28 ENCOUNTER — Other Ambulatory Visit (HOSPITAL_COMMUNITY)
Admission: RE | Admit: 2017-07-28 | Discharge: 2017-07-28 | Disposition: A | Discharge status: Home / Self Care | Payer: PPO | Source: Ambulatory Visit | Attending: Hematology | Admitting: Hematology

## 2017-07-28 ENCOUNTER — Ambulatory Visit (HOSPITAL_BASED_OUTPATIENT_CLINIC_OR_DEPARTMENT_OTHER): Payer: PPO

## 2017-07-28 ENCOUNTER — Ambulatory Visit (HOSPITAL_BASED_OUTPATIENT_CLINIC_OR_DEPARTMENT_OTHER): Payer: PPO | Admitting: Hematology

## 2017-07-28 ENCOUNTER — Encounter: Payer: Self-pay | Admitting: Hematology

## 2017-07-28 VITALS — BP 155/73 | HR 68 | Temp 97.8°F | Resp 19 | Ht 61.5 in | Wt 176.4 lb

## 2017-07-28 DIAGNOSIS — D72820 Lymphocytosis (symptomatic): Secondary | ICD-10-CM

## 2017-07-28 DIAGNOSIS — D473 Essential (hemorrhagic) thrombocythemia: Secondary | ICD-10-CM | POA: Diagnosis not present

## 2017-07-28 LAB — CBC & DIFF AND RETIC
BASO%: 0.2 % (ref 0.0–2.0)
Basophils Absolute: 0 10*3/uL (ref 0.0–0.1)
EOS%: 0.9 % (ref 0.0–7.0)
Eosinophils Absolute: 0.1 10*3/uL (ref 0.0–0.5)
HCT: 41.5 % (ref 34.8–46.6)
HGB: 14.3 g/dL (ref 11.6–15.9)
Immature Retic Fract: 3.6 % (ref 1.60–10.00)
LYMPH%: 55.8 % — ABNORMAL HIGH (ref 14.0–49.7)
MCH: 29.9 pg (ref 25.1–34.0)
MCHC: 34.5 g/dL (ref 31.5–36.0)
MCV: 86.8 fL (ref 79.5–101.0)
MONO#: 0.4 10*3/uL (ref 0.1–0.9)
MONO%: 3.4 % (ref 0.0–14.0)
NEUT#: 4.1 10*3/uL (ref 1.5–6.5)
NEUT%: 39.7 % (ref 38.4–76.8)
Platelets: 247 10*3/uL (ref 145–400)
RBC: 4.78 10*6/uL (ref 3.70–5.45)
RDW: 13 % (ref 11.2–14.5)
Retic %: 1.58 % (ref 0.70–2.10)
Retic Ct Abs: 75.52 10*3/uL (ref 33.70–90.70)
WBC: 10.2 10*3/uL (ref 3.9–10.3)
lymph#: 5.7 10*3/uL — ABNORMAL HIGH (ref 0.9–3.3)

## 2017-07-28 LAB — COMPREHENSIVE METABOLIC PANEL
ALBUMIN: 4 g/dL (ref 3.5–5.0)
ALK PHOS: 86 U/L (ref 40–150)
ALT: 18 U/L (ref 0–55)
ANION GAP: 9 meq/L (ref 3–11)
AST: 16 U/L (ref 5–34)
BILIRUBIN TOTAL: 0.66 mg/dL (ref 0.20–1.20)
BUN: 14.2 mg/dL (ref 7.0–26.0)
CO2: 29 mEq/L (ref 22–29)
Calcium: 10.2 mg/dL (ref 8.4–10.4)
Chloride: 102 mEq/L (ref 98–109)
Creatinine: 1.1 mg/dL (ref 0.6–1.1)
EGFR: 54 mL/min/{1.73_m2} — AB (ref >90)
Glucose: 99 mg/dl (ref 70–140)
Potassium: 3.7 mEq/L (ref 3.5–5.1)
Sodium: 140 mEq/L (ref 136–145)
TOTAL PROTEIN: 6.8 g/dL (ref 6.4–8.3)

## 2017-07-28 LAB — LACTATE DEHYDROGENASE: LDH: 155 U/L (ref 125–245)

## 2017-07-28 NOTE — Progress Notes (Signed)
Marland Kitchen    HEMATOLOGY/ONCOLOGY CONSULTATION NOTE  Date of Service: 07/28/2017  Patient Care Team: Julie Stains, MD as PCP - General (Family Medicine)  CHIEF COMPLAINTS/PURPOSE OF CONSULTATION:  lymphocytosis  HISTORY OF PRESENTING ILLNESS:   Julie Rogers is a wonderful 66 y.o. female who has been referred to [place] by Dr .Julie Stains, MD for evaluation and management of lymphocytosis. Patient is a history of hypertension, seasonal allergies, Mnire's disease, active smoker, chronic kidney disease who was referred to [place] for evaluation of lymphocytosis.  Patient reports that she has had lymphocytosis is on and off for about 9-10 years. Based on lab sent to [place] the patient has had mild leukocytosis with predominantly lymphocytosis of 5.4k on 01/21/2015, 5.9k on 02/10/2016 and 6.6k on 06/17/2017.  Patient notes no fevers no chills no night sweats. Does not report any enlarged lymph nodes. No abdominal distention or abdominal pain.  Notes that she feels quite well and has no acute new concerns. She is still smoking about half a pack a day and notes that she is not at all interested in quitting. We discussed that lymphocytosis could be reactive related to her smoking but we would consider a workup to rule out clonal process. She is okay with this approach.  Notes that her weight has been fairly steady with no unexplained weight loss. She notes that she has intentional weight loss of about 18 pounds with diet and exercise.    MEDICAL HISTORY:  Past Medical History:  Diagnosis Date  . Arthritis   . Hypertension   . Meniere disease    takes maxide  . Seasonal allergies   . Smoker   Hypertensive chronic kidney disease stage III Glucose intolerance Obesity .Body mass index is 32.79 kg/m. 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 ABDOMINAL HYSTERECTOMY    . CARPAL TUNNEL RELEASE Right   . CARPAL TUNNEL RELEASE Left 02/11/2017   Procedure: Left Limited open Carpal tunnel release ;  Surgeon: Roseanne Kaufman, MD;  Location: [facility];  Service: Orthopedics;  Laterality: Left;  Requests 60 mins  . MIDDLE EAR SURGERY Right    Dr Ernesto Rutherford  . TRIGGER FINGER RELEASE Right   . TUBAL LIGATION      SOCIAL HISTORY: Social History   Social History  . Marital status: Married    Spouse name: N/A  . Number of children: N/A  . Years of education: N/A   Occupational History  . Not on file.   Social History Main Topics  . Smoking status: Current Every Day Smoker    Packs/day: 0.50    Years: 35.00  . Smokeless tobacco: Never Used  . Alcohol use Yes     Comment: social  . Drug use: Unknown  . Sexual activity: Not on file   Other Topics Concern  . Not on file   Social History Narrative  . No narrative on file    FAMILY HISTORY: History reviewed. No pertinent family history.  ALLERGIES:  has No Known Allergies.  MEDICATIONS:  Current Outpatient Prescriptions  Medication Sig Dispense Refill  . Cholecalciferol (VITAMIN D3) 2000 units TABS Take by mouth.    . estradiol (VIVELLE-DOT) 0.1 MG/24HR patch Place 1 patch onto the skin 2 (two) times a week.    . fexofenadine (ALLEGRA) 180 MG tablet Take 180 mg by mouth daily.    Marland Kitchen  losartan (COZAAR) 25 MG tablet Take 25 mg by mouth daily.    Marland Kitchen triamterene-hydrochlorothiazide (MAXZIDE-25) 37.5-25 MG tablet Take 1 tablet by mouth daily.     No current facility-administered medications for this visit.     REVIEW OF SYSTEMS:    10 Point review of Systems was done is negative except as noted above.  PHYSICAL EXAMINATION: ECOG PERFORMANCE STATUS: 0 - Asymptomatic  . Vitals:   07/28/17 1300  BP: (!) 155/73  Pulse: 68  Resp: 19  Temp: 97.8 F (36.6 C)  SpO2: 100%   Filed Weights   07/28/17 1300  Weight: 176 lb 6.4 oz (80 kg)     .Body mass index is 32.79 kg/m.  GENERAL:alert, in no acute distress and comfortable SKIN: no acute rashes, no significant lesions EYES: conjunctiva are pink and non-injected, sclera anicteric OROPHARYNX: MMM, no exudates, no oropharyngeal erythema or ulceration NECK: supple, no JVD LYMPH:  no palpable lymphadenopathy in the cervical, axillary or inguinal regions LUNGS: clear to auscultation b/l with normal respiratory effort HEART: regular rate & rhythm ABDOMEN:  normoactive bowel sounds , non tender, not distended.No palpable hepatosplenomegaly Extremity: no pedal edema PSYCH: alert & oriented x 3 with fluent speech NEURO: no focal motor/sensory deficits  LABORATORY DATA:  I have reviewed the data as listed  . CBC Latest Ref Rng & Units 07/28/2017 10/31/2009 10/23/2009  WBC 3.9 - 10.3 10e3/uL 10.2 12.8(H) 16.9(H)  Hemoglobin 11.6 - 15.9 g/dL 14.3 13.5 10.8 DELTA CHECK NOTED(L)  Hematocrit 34.8 - 46.6 % 41.5 38.9 31.5(L)  Platelets 145 - 400 10e3/uL 247 309 169 DELTA CHECK NOTED PLATELET COUNT CONFIRMED BY SMEAR SPECIMEN CHECKED FOR CLOTS   . CBC    Component Value Date/Time   WBC 10.2 07/28/2017 1416   WBC 12.8 (H) 10/31/2009 1516   RBC 4.78 07/28/2017 1416   RBC 4.31 10/31/2009 1516   HGB 14.3 07/28/2017 1416   HCT 41.5 07/28/2017 1416   PLT 247 07/28/2017 1416   MCV 86.8 07/28/2017 1416   MCH 29.9 07/28/2017 1416   MCHC 34.5 07/28/2017 1416   MCHC 34.6 10/31/2009 1516   RDW 13.0 07/28/2017 1416   LYMPHSABS 5.7 (H) 07/28/2017 1416   MONOABS 0.4 07/28/2017 1416   EOSABS 0.1 07/28/2017 1416   BASOSABS 0.0 07/28/2017 1416     . CMP Latest Ref Rng & Units 07/28/2017 02/08/2017 10/31/2009  Glucose 70 - 140 mg/dl 99 118(H) 94  BUN 7.0 - 26.0 mg/dL 14.2 16 14   Creatinine 0.6 - 1.1 mg/dL 1.1 0.93 0.87  Sodium 136 - 145 mEq/L 140 138 136  Potassium 3.5 - 5.1 mEq/L 3.7 4.7 4.1  Chloride 101 - 111 mmol/L - 100(L) 102  CO2 22 - 29 mEq/L 29 25 27   Calcium 8.4 - 10.4  mg/dL 10.2 9.6 9.6  Total Protein 6.4 - 8.3 g/dL 6.8 - -  Total Bilirubin 0.20 - 1.20 mg/dL 0.66 - -  Alkaline Phos 40 - 150 U/L 86 - -  AST 5 - 34 U/L 16 - -  ALT 0 - 55 U/L 18 - -   Component     Latest Ref Rng & Units 07/28/2017  Sed Rate     0 - 40 mm/hr 2  LDH     125 - 245 U/L 155  Hep C Virus Ab     0.0 - 0.9 s/co ratio <0.1    RADIOGRAPHIC STUDIES: I have personally reviewed the radiological images as listed and agreed with the findings in  the report. No results found.  ASSESSMENT & PLAN:   66 year old Caucasian female with  #1 Mild nonprogressive Chronic Lymphocytosis Her lymphocyte counts of been in the 5-7k range since at least 2016. Lymphocyte counts are down from 6.6k on 05/16/2017 down to 5.7k today on 07/29/2017.  Her chronic lymphocytosis appears to be likely reactive related to her smoking. We will rule out chronic lymphoproliferative clonal process. Patient has no constitutional symptoms, has a normal LDH, no palpable hepatosplenomegaly or lymphadenopathy to suggest a lymphoproliferative process at this time.  LDH within normal limits  Hepatitis C antibody negative Sedimentation rate with the normal limits All other associated cytopenias or CBC abnormalities. Plan -Labs were done today and were reviewed. -I discussed with the patient my consideration of this likely being a reactive lymphocytosis due to her smoking. She has no other overt stigmata of lymphoma. -Hepatitis C negative. Low risk for HIV. -Flow cytometry has been ordered and the results of this are currently pending. This would help rule out the possibility of this being a clonal disorder. -If the flow cytometry is negative no additional workup would be recommended at this time. The flow cytometry shows a clonal population the patient will likely need a PET CT scan and possibly a bone marrow examination. We will follow-up on the results of this. -Patient was strongly counseled to pursue smoking  cessation but is not interested in considering this currently.   Labs today RTC with Dr Irene Limbo based on labs results  -continue follow-up with primary care physician   All of the patients questions were answered with apparent satisfaction. The patient knows to call the clinic with any problems, questions or concerns.  I spent 40 minutes counseling the patient face to face. The total time spent in the appointment was 60 minutes and more than 50% was on counseling and direct patient cares.    Sullivan Lone MD [place] AAHIVMS [facility] Hematology/Oncology Physician [facility]  (Office):       [phone] (Work cell):  [phone] (Fax):           [phone]  07/28/2017 1:18 PM

## 2017-07-28 NOTE — Patient Instructions (Signed)
Thank you for choosing [facility] to provide your oncology and hematology care.  To afford each patient quality time with our providers, please arrive 30 minutes before your scheduled appointment time.  If you arrive late for your appointment, you may be asked to reschedule.  We strive to give you quality time with our providers, and arriving late affects you and other patients whose appointments are after yours.   If you are a no show for multiple scheduled visits, you may be dismissed from the clinic at the providers discretion.    Again, thank you for choosing [facility], our hope is that these requests will decrease the amount of time that you wait before being seen by our physicians.  ______________________________________________________________________  Should you have questions after your visit to the [facility], please contact our office at [phone] between the hours of 8:30 and 4:30 p.m.    Voicemails left after 4:30p.m will not be returned until the following business day.    For prescription refill requests, please have your pharmacy contact us directly.  Please also try to allow 48 hours for prescription requests.    Please contact the scheduling department for questions regarding scheduling.  For scheduling of procedures such as PET scans, CT scans, MRI, Ultrasound, etc please contact central scheduling at [phone].    Resources For Cancer Patients and Caregivers:   Oncolink.org:  A wonderful resource for patients and healthcare providers for information regarding your disease, ways to tract your treatment, what to expect, etc.     American Cancer Society:  800-227-2345  Can help patients locate various types of support and financial assistance  Cancer Care: 1-800-813-HOPE (4673) Provides financial assistance, online support groups, medication/co-pay assistance.    [place] DSS:  336-641-3447 Where to apply for food  stamps, Medicaid, and utility assistance  Medicare Rights Center: 800-333-4114 Helps people with Medicare understand their rights and benefits, navigate the Medicare system, and secure the quality healthcare they deserve  SCAT: 336-333-6589 [place] [employer]'s shared-ride transportation service for eligible riders who have a disability that prevents them from riding the fixed route bus.    For additional information on assistance programs please contact our social worker:   Grier Hock/Abigail Elmore:  [phone]            

## 2017-07-29 LAB — HEPATITIS C ANTIBODY: Hep C Virus Ab: <0.1 s/co ratio (ref 0.0–0.9)

## 2017-07-29 LAB — SEDIMENTATION RATE: Sedimentation Rate-Westergren: 2 mm/hr (ref 0–40)

## 2017-08-02 LAB — FLOW CYTOMETRY

## 2017-08-05 ENCOUNTER — Telehealth: Payer: Self-pay

## 2017-08-05 NOTE — Telephone Encounter (Signed)
Pt called for lab results from 8/9

## 2017-08-08 ENCOUNTER — Telehealth: Payer: Self-pay | Admitting: *Deleted

## 2017-08-08 NOTE — Telephone Encounter (Signed)
Pt called inquiring about lab results.  Per Dr. Irene Limbo, there is an abnormality on the flow cytometry report.  Plan to bring pt back in week to discuss in more detail.  Pt verbalized understanding.  Scheduling message sent.

## 2017-08-09 ENCOUNTER — Telehealth: Payer: Self-pay | Admitting: Hematology

## 2017-08-09 NOTE — Telephone Encounter (Signed)
Called patient and scheduled them an appointment with Dr.Kale on Sept. 4th 1st available.

## 2017-08-23 ENCOUNTER — Ambulatory Visit: Payer: PPO | Admitting: Hematology

## 2017-08-31 ENCOUNTER — Telehealth: Payer: Self-pay | Admitting: Hematology

## 2017-08-31 ENCOUNTER — Encounter (INDEPENDENT_AMBULATORY_CARE_PROVIDER_SITE_OTHER): Payer: Self-pay

## 2017-08-31 ENCOUNTER — Encounter: Payer: Self-pay | Admitting: Hematology

## 2017-08-31 ENCOUNTER — Ambulatory Visit (HOSPITAL_BASED_OUTPATIENT_CLINIC_OR_DEPARTMENT_OTHER): Payer: PPO | Admitting: Hematology

## 2017-08-31 VITALS — BP 128/83 | HR 76 | Temp 98.8°F | Resp 20 | Ht 61.5 in | Wt 172.9 lb

## 2017-08-31 DIAGNOSIS — D72820 Lymphocytosis (symptomatic): Secondary | ICD-10-CM

## 2017-08-31 NOTE — Progress Notes (Signed)
Marland Kitchen    HEMATOLOGY/ONCOLOGY CONSULTATION NOTE  Date of Service: 08/31/2017  Patient Care Team: Harlan Stains, MD as PCP - General (Family Medicine)  CHIEF COMPLAINTS/PURPOSE OF CONSULTATION:  lymphocytosis  HISTORY OF PRESENTING ILLNESS:   Julie Rogers is a wonderful 66 y.o. female who has been referred to [place] by Dr .Harlan Stains, MD for evaluation and management of lymphocytosis. Patient is a history of hypertension, seasonal allergies, Mnire's disease, active smoker, chronic kidney disease who was referred to [place] for evaluation of lymphocytosis.  Patient reports that she has had lymphocytosis is on and off for about 9-10 years. Based on lab sent to [place] the patient has had mild leukocytosis with predominantly lymphocytosis of 5.4k on 01/21/2015, 5.9k on 02/10/2016 and 6.6k on 06/17/2017.  Patient notes no fevers no chills no night sweats. Does not report any enlarged lymph nodes. No abdominal distention or abdominal pain.  Notes that she feels quite well and has no acute new concerns. She is still smoking about half a pack a day and notes that she is not at all interested in quitting. We discussed that lymphocytosis could be reactive related to her smoking but we would consider a workup to rule out clonal process. She is okay with this approach.  Notes that her weight has been fairly steady with no unexplained weight loss. She notes that she has intentional weight loss of about 18 pounds with diet and exercise.  INTERIM HISTORY:  Julie Rogers returns today for follow up. Overall she feels well and is without complaint. She denies night sweats, fever, chills, or any other associated symptoms.   MEDICAL HISTORY:  Past Medical History:  Diagnosis Date  . Arthritis   . Hypertension   . Meniere disease    takes maxide  . Seasonal allergies   . Smoker   Hypertensive chronic kidney disease stage III Glucose intolerance Obesity .Body mass index is 32.14 kg/m. 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 ABDOMINAL HYSTERECTOMY    . CARPAL TUNNEL RELEASE Right   . CARPAL TUNNEL RELEASE Left 02/11/2017   Procedure: Left Limited open Carpal tunnel release ;  Surgeon: Roseanne Kaufman, MD;  Location: [facility];  Service: Orthopedics;  Laterality: Left;  Requests 60 mins  . MIDDLE EAR SURGERY Right    Dr Ernesto Rutherford  . TRIGGER FINGER RELEASE Right   . TUBAL LIGATION      SOCIAL HISTORY: Social History   Social History  . Marital status: Married    Spouse name: N/A  . Number of children: N/A  . Years of education: N/A   Occupational History  . Not on file.   Social History Main Topics  . Smoking status: Current Every Day Smoker    Packs/day: 0.50    Years: 35.00  . Smokeless tobacco: Never Used  . Alcohol use Yes     Comment: social  . Drug use: Unknown  . Sexual activity: Not on file   Other Topics Concern  . Not on file   Social History Narrative  . No narrative on file    FAMILY HISTORY: History reviewed. No pertinent family history.  ALLERGIES:  has No Known Allergies.  MEDICATIONS:  Current Outpatient Prescriptions  Medication Sig Dispense Refill  . Cholecalciferol (VITAMIN D3) 2000 units TABS Take by mouth.    . estradiol (VIVELLE-DOT) 0.1 MG/24HR  patch Place 1 patch onto the skin 2 (two) times a week.    . fexofenadine (ALLEGRA) 180 MG tablet Take 180 mg by mouth daily.    Marland Kitchen losartan (COZAAR) 25 MG tablet Take 25 mg by mouth daily.    Marland Kitchen triamterene-hydrochlorothiazide (MAXZIDE-25) 37.5-25 MG tablet Take 1 tablet by mouth daily.     No current facility-administered medications for this visit.     REVIEW OF SYSTEMS:    10 Point review of Systems was done is negative except as noted above.  PHYSICAL EXAMINATION: ECOG PERFORMANCE STATUS: 0 -  Asymptomatic  . Vitals:   08/31/17 1429  BP: 128/83  Pulse: 76  Resp: 20  Temp: 98.8 F (37.1 C)  SpO2: 99%   Filed Weights   08/31/17 1429  Weight: 172 lb 14.4 oz (78.4 kg)   .Body mass index is 32.14 kg/m.  GENERAL:alert, in no acute distress and comfortable SKIN: no acute rashes, no significant lesions EYES: conjunctiva are pink and non-injected, sclera anicteric OROPHARYNX: MMM, no exudates, no oropharyngeal erythema or ulceration NECK: supple, no JVD LYMPH:  no palpable lymphadenopathy in the cervical, axillary or inguinal regions LUNGS: clear to auscultation b/l with normal respiratory effort HEART: regular rate & rhythm ABDOMEN:  normoactive bowel sounds , non tender, not distended.No palpable hepatosplenomegaly Extremity: no pedal edema PSYCH: alert & oriented x 3 with fluent speech NEURO: no focal motor/sensory deficits  LABORATORY DATA:  I have reviewed the data as listed  . CBC Latest Ref Rng & Units 07/28/2017 10/31/2009 10/23/2009  WBC 3.9 - 10.3 10e3/uL 10.2 12.8(H) 16.9(H)  Hemoglobin 11.6 - 15.9 g/dL 14.3 13.5 10.8 DELTA CHECK NOTED(L)  Hematocrit 34.8 - 46.6 % 41.5 38.9 31.5(L)  Platelets 145 - 400 10e3/uL 247 309 169 DELTA CHECK NOTED PLATELET COUNT CONFIRMED BY SMEAR SPECIMEN CHECKED FOR CLOTS   . CBC    Component Value Date/Time   WBC 10.2 07/28/2017 1416   WBC 12.8 (H) 10/31/2009 1516   RBC 4.78 07/28/2017 1416   RBC 4.31 10/31/2009 1516   HGB 14.3 07/28/2017 1416   HCT 41.5 07/28/2017 1416   PLT 247 07/28/2017 1416   MCV 86.8 07/28/2017 1416   MCH 29.9 07/28/2017 1416   MCHC 34.5 07/28/2017 1416   MCHC 34.6 10/31/2009 1516   RDW 13.0 07/28/2017 1416   LYMPHSABS 5.7 (H) 07/28/2017 1416   MONOABS 0.4 07/28/2017 1416   EOSABS 0.1 07/28/2017 1416   BASOSABS 0.0 07/28/2017 1416     . CMP Latest Ref Rng & Units 07/28/2017 02/08/2017 10/31/2009  Glucose 70 - 140 mg/dl 99 118(H) 94  BUN 7.0 - 26.0 mg/dL 14.2 16 14   Creatinine 0.6 - 1.1  mg/dL 1.1 0.93 0.87  Sodium 136 - 145 mEq/L 140 138 136  Potassium 3.5 - 5.1 mEq/L 3.7 4.7 4.1  Chloride 101 - 111 mmol/L - 100(L) 102  CO2 22 - 29 mEq/L 29 25 27   Calcium 8.4 - 10.4 mg/dL 10.2 9.6 9.6  Total Protein 6.4 - 8.3 g/dL 6.8 - -  Total Bilirubin 0.20 - 1.20 mg/dL 0.66 - -  Alkaline Phos 40 - 150 U/L 86 - -  AST 5 - 34 U/L 16 - -  ALT 0 - 55 U/L 18 - -   Component     Latest Ref Rng & Units 07/28/2017  Sed Rate     0 - 40 mm/hr 2  LDH     125 - 245 U/L 155  Hep C Virus Ab  0.0 - 0.9 s/co ratio <0.1     RADIOGRAPHIC STUDIES: I have personally reviewed the radiological images as listed and agreed with the findings in the report. No results found.  ASSESSMENT & PLAN:   66 year old Caucasian female with  #1 Mild nonprogressive Chronic Lymphocytosis- Newly Diagnosed monoclonal B lymphocytosis (61% of the lymphocytes). Her lymphocyte counts of been in the 5-7k range since at least 2016. Lymphocyte counts are down from 6.6k on 05/16/2017 down to 5.7k today on 07/29/2017.  Patient has no constitutional symptoms, has a normal LDH, no palpable hepatosplenomegaly or lymphadenopathy to suggest that this monoclonal B lymphocytosis represents CLL at this time.  LDH within normal limits  Hepatitis C antibody negative Sedimentation rate with the normal limits All other associated cytopenias or CBC abnormalities. Plan -I discussed with the patient the etiology of her lymphocytosis and the results of her flow cytometry showing monoclonal B lymphocytosis making up about 61% of her lymphocytes. -No clinically overt hepatosplenomegaly constitutional symptoms or lymphadenopathy to suggest a diagnosis of CLL. -We discussed the recommendations to monitor her blood counts and symptoms and clinical examination to monitor for progression to CLL. She was counseled on important symptoms to monitor for. -She would like to transfer care to Dr Lattie Haw since his clinic is easier to get to  and he took care of her husband and she knows well. We will have her f/u w him in 57mo.  -Patient was previously strongly counseled to pursue smoking cessation but is not interested in considering this currently.  All of the patients questions were answered with apparent satisfaction. The patient knows to call the clinic with any problems, questions or concerns.  I spent 20 minutes counseling the patient face to face. The total time spent in the appointment was 30 minutes and more than 50% was on counseling and direct patient cares.    Sullivan Lone MD [place] AAHIVMS [facility] Hematology/Oncology Physician [facility]  (Office):       [phone] (Work cell):  [phone] (Fax):           [phone]  08/31/2017 3:24 PM  This document serves as a record of services personally performed by Sullivan Lone, MD. It was created on his behalf by Reola Mosher, a trained medical scribe. The creation of this record is based on the scribe's personal observations and the provider's statements to them. This document has been checked and approved by the attending provider.

## 2017-08-31 NOTE — Telephone Encounter (Signed)
Per 9/12 los - appts to be scheduled for Dr. Jonette Eva - sch message sent to [facility] scheduler

## 2017-09-13 DIAGNOSIS — M1711 Unilateral primary osteoarthritis, right knee: Secondary | ICD-10-CM | POA: Diagnosis not present

## 2017-12-20 DIAGNOSIS — C4491 Basal cell carcinoma of skin, unspecified: Secondary | ICD-10-CM

## 2017-12-20 HISTORY — DX: Basal cell carcinoma of skin, unspecified: C44.91

## 2018-01-09 DIAGNOSIS — D72820 Lymphocytosis (symptomatic): Secondary | ICD-10-CM | POA: Diagnosis not present

## 2018-01-09 DIAGNOSIS — E785 Hyperlipidemia, unspecified: Secondary | ICD-10-CM | POA: Diagnosis not present

## 2018-01-09 DIAGNOSIS — R7303 Prediabetes: Secondary | ICD-10-CM | POA: Diagnosis not present

## 2018-01-09 DIAGNOSIS — I129 Hypertensive chronic kidney disease with stage 1 through stage 4 chronic kidney disease, or unspecified chronic kidney disease: Secondary | ICD-10-CM | POA: Diagnosis not present

## 2018-01-09 DIAGNOSIS — E2839 Other primary ovarian failure: Secondary | ICD-10-CM | POA: Diagnosis not present

## 2018-01-09 DIAGNOSIS — F17200 Nicotine dependence, unspecified, uncomplicated: Secondary | ICD-10-CM | POA: Diagnosis not present

## 2018-01-09 DIAGNOSIS — Z1211 Encounter for screening for malignant neoplasm of colon: Secondary | ICD-10-CM | POA: Diagnosis not present

## 2018-01-09 DIAGNOSIS — Z23 Encounter for immunization: Secondary | ICD-10-CM | POA: Diagnosis not present

## 2018-01-09 DIAGNOSIS — N183 Chronic kidney disease, stage 3 (moderate): Secondary | ICD-10-CM | POA: Diagnosis not present

## 2018-01-13 DIAGNOSIS — M1711 Unilateral primary osteoarthritis, right knee: Secondary | ICD-10-CM | POA: Diagnosis not present

## 2018-03-01 ENCOUNTER — Inpatient Hospital Stay: Payer: PPO

## 2018-03-01 ENCOUNTER — Inpatient Hospital Stay: Payer: PPO | Admitting: Hematology & Oncology

## 2018-03-06 DIAGNOSIS — M8588 Other specified disorders of bone density and structure, other site: Secondary | ICD-10-CM | POA: Diagnosis not present

## 2018-03-06 DIAGNOSIS — E2839 Other primary ovarian failure: Secondary | ICD-10-CM | POA: Diagnosis not present

## 2018-03-16 ENCOUNTER — Inpatient Hospital Stay (HOSPITAL_BASED_OUTPATIENT_CLINIC_OR_DEPARTMENT_OTHER): Payer: PPO | Admitting: Hematology & Oncology

## 2018-03-16 ENCOUNTER — Inpatient Hospital Stay: Payer: PPO | Attending: Hematology & Oncology

## 2018-03-16 VITALS — BP 144/65 | HR 67 | Temp 97.8°F | Resp 18 | Wt 172.0 lb

## 2018-03-16 DIAGNOSIS — D72820 Lymphocytosis (symptomatic): Secondary | ICD-10-CM | POA: Insufficient documentation

## 2018-03-16 LAB — CBC WITH DIFFERENTIAL (CANCER CENTER ONLY)
Basophils Absolute: 0 10*3/uL (ref 0.0–0.1)
Basophils Relative: 0 %
Eosinophils Absolute: 0.1 10*3/uL (ref 0.0–0.5)
Eosinophils Relative: 1 %
HEMATOCRIT: 42.1 % (ref 34.8–46.6)
HEMOGLOBIN: 14.2 g/dL (ref 11.6–15.9)
LYMPHS ABS: 6.2 10*3/uL — AB (ref 0.9–3.3)
LYMPHS PCT: 53 %
MCH: 29.5 pg (ref 26.0–34.0)
MCHC: 33.7 g/dL (ref 32.0–36.0)
MCV: 87.5 fL (ref 81.0–101.0)
MONOS PCT: 5 %
Monocytes Absolute: 0.5 10*3/uL (ref 0.1–0.9)
NEUTROS ABS: 4.7 10*3/uL (ref 1.5–6.5)
NEUTROS PCT: 41 %
Platelet Count: 235 10*3/uL (ref 145–400)
RBC: 4.81 MIL/uL (ref 3.70–5.32)
RDW: 13.1 % (ref 11.1–15.7)
WBC: 11.6 10*3/uL — AB (ref 3.9–10.0)

## 2018-03-16 LAB — COMPREHENSIVE METABOLIC PANEL
ALK PHOS: 93 U/L — AB (ref 26–84)
ALT: 31 U/L (ref 10–47)
AST: 22 U/L (ref 11–38)
Albumin: 4 g/dL (ref 3.5–5.0)
Anion gap: 6 (ref 5–15)
BILIRUBIN TOTAL: 0.7 mg/dL (ref 0.2–1.6)
BUN: 18 mg/dL (ref 7–22)
CHLORIDE: 104 mmol/L (ref 98–108)
CO2: 33 mmol/L (ref 18–33)
Calcium: 9.8 mg/dL (ref 8.0–10.3)
Creatinine, Ser: 1.5 mg/dL — ABNORMAL HIGH (ref 0.60–1.20)
Glucose, Bld: 108 mg/dL (ref 73–118)
POTASSIUM: 3.7 mmol/L (ref 3.3–4.7)
Sodium: 143 mmol/L (ref 128–145)
TOTAL PROTEIN: 7 g/dL (ref 6.4–8.1)

## 2018-03-16 LAB — SAVE SMEAR

## 2018-03-16 NOTE — Progress Notes (Signed)
Hematology and Oncology Follow Up Visit  Julie Rogers [facility] [id number] [birth year] 67 y.o. 03/16/2018   Principle Diagnosis:   Monoclonal B-cell lymphocytosis  Current Therapy:    Observation     Interim History:  Julie Rogers is comes in for her first office visit to the [facility].  She had been seen previously by Dr.Kale.  Since I have known her for many years from a church, and since I have taken care of her husband, she just wanted to be seen by our office.  We are a little bit closer to where she lives.  She was last seen 6 months ago.  She has had no problems.  She is still smoking.  She says that will not stop smoking.  I will certainly not press the issue.  I know that her family doctor is doing her part to try to get Julie Rogers to stop smoking.  She has had no issues with nausea or vomiting.  She has had no swollen lymph nodes.  She is had no change in bowel or bladder habits..  She had a mammogram a year ago.  She says she promises to get a colonoscopy this year.  She is not sure when she had her last one.  Of note, she had the diagnosis confirmed when she underwent a flow cytometry evaluation.  This was done in August 2018.  The flow cytometry report ([id number]) showed a monoclonal B-cell population consistent with monoclonal B-cell lymphocytosis.  Currently, her performance status is ECOG 0.  Medications:  Current Outpatient Medications:  .  Cholecalciferol (VITAMIN D3) 2000 units TABS, Take by mouth., Disp: , Rfl:  .  estradiol (VIVELLE-DOT) 0.1 MG/24HR patch, Place 1 patch onto the skin 2 (two) times a week., Disp: , Rfl:  .  fexofenadine (ALLEGRA) 180 MG tablet, Take 180 mg by mouth daily., Disp: , Rfl:  .  losartan (COZAAR) 25 MG tablet, Take 25 mg by mouth daily., Disp: , Rfl:  .  triamterene-hydrochlorothiazide (MAXZIDE-25) 37.5-25 MG tablet, Take 1 tablet by mouth daily., Disp: , Rfl:   Allergies: No Known Allergies  Past Medical History,  Surgical history, Social history, and Family History were reviewed and updated.  Review of Systems: Review of Systems  Constitutional: Negative.   HENT:  Negative.   Eyes: Negative.   Respiratory: Negative.   Cardiovascular: Negative.   Gastrointestinal: Negative.   Endocrine: Negative.   Genitourinary: Negative.    Musculoskeletal: Negative.   Skin: Negative.   Neurological: Negative.   Hematological: Negative.   Psychiatric/Behavioral: Negative.     Physical Exam:  weight is 172 lb (78 kg). Her oral temperature is 97.8 F (36.6 C). Her blood pressure is 144/65 (abnormal) and her pulse is 67. Her respiration is 18 and oxygen saturation is 99%.   Wt Readings from Last 3 Encounters:  03/16/18 172 lb (78 kg)  08/31/17 172 lb 14.4 oz (78.4 kg)  07/28/17 176 lb 6.4 oz (80 kg)    Physical Exam  Constitutional: She is oriented to person, place, and time.  HENT:  Head: Normocephalic and atraumatic.  Mouth/Throat: Oropharynx is clear and moist.  Eyes: Pupils are equal, round, and reactive to light. EOM are normal.  Neck: Normal range of motion.  Cardiovascular: Normal rate, regular rhythm and normal heart sounds.  Pulmonary/Chest: Effort normal and breath sounds normal.  Abdominal: Soft. Bowel sounds are normal.  Musculoskeletal: Normal range of motion. She exhibits no edema, tenderness or deformity.  Lymphadenopathy:  She has no cervical adenopathy.  Neurological: She is alert and oriented to person, place, and time.  Skin: Skin is warm and dry. No rash noted. No erythema.  Psychiatric: She has a normal mood and affect. Her behavior is normal. Judgment and thought content normal.  Vitals reviewed.    Lab Results  Component Value Date   WBC 11.6 (H) 03/16/2018   HGB 14.3 07/28/2017   HCT 42.1 03/16/2018   MCV 87.5 03/16/2018   PLT 235 03/16/2018     Chemistry      Component Value Date/Time   NA 143 03/16/2018 1454   NA 140 07/28/2017 1416   K 3.7 03/16/2018  1454   K 3.7 07/28/2017 1416   CL 104 03/16/2018 1454   CO2 33 03/16/2018 1454   CO2 29 07/28/2017 1416   BUN 18 03/16/2018 1454   BUN 14.2 07/28/2017 1416   CREATININE 1.50 (H) 03/16/2018 1454   CREATININE 1.1 07/28/2017 1416      Component Value Date/Time   CALCIUM 9.8 03/16/2018 1454   CALCIUM 10.2 07/28/2017 1416   ALKPHOS 93 (H) 03/16/2018 1454   ALKPHOS 86 07/28/2017 1416   AST 22 03/16/2018 1454   AST 16 07/28/2017 1416   ALT 31 03/16/2018 1454   ALT 18 07/28/2017 1416   BILITOT 0.7 03/16/2018 1454   BILITOT 0.66 07/28/2017 1416      Impression and Plan: Julie Rogers is a 67 year old white female.  She has been quite healthy.  She has a monoclonal B-cell lymphocytosis.  I told her that in all likelihood, this will not turn into a malignant disease.  However, we have to follow this along.  I will plan to see her back in 51-month intervals.  I think this would be reasonable.  It was nice to see her again.  She has lost quite a bit of weight since we last saw her.  I am happy for her.  She is doing her best to lose weight.  I spent about 30 minutes with her.  Over half the time was spent face-to-face going over her lab studies, reviewing the flow cytometry report, and answering her questions.   Volanda Napoleon, MD 3/28/20195:03 PM

## 2018-03-17 LAB — BETA 2 MICROGLOBULIN, SERUM: BETA 2 MICROGLOBULIN: 1.9 mg/L (ref 0.6–2.4)

## 2018-03-17 LAB — LACTATE DEHYDROGENASE: LDH: 181 U/L (ref 125–245)

## 2018-06-06 DIAGNOSIS — J301 Allergic rhinitis due to pollen: Secondary | ICD-10-CM | POA: Diagnosis not present

## 2018-06-06 DIAGNOSIS — H8101 Meniere's disease, right ear: Secondary | ICD-10-CM | POA: Diagnosis not present

## 2018-06-06 DIAGNOSIS — H9311 Tinnitus, right ear: Secondary | ICD-10-CM | POA: Diagnosis not present

## 2018-06-06 DIAGNOSIS — H903 Sensorineural hearing loss, bilateral: Secondary | ICD-10-CM | POA: Diagnosis not present

## 2018-08-09 ENCOUNTER — Other Ambulatory Visit: Payer: Self-pay | Admitting: Dermatology

## 2018-08-09 DIAGNOSIS — D1801 Hemangioma of skin and subcutaneous tissue: Secondary | ICD-10-CM | POA: Diagnosis not present

## 2018-08-09 DIAGNOSIS — D485 Neoplasm of uncertain behavior of skin: Secondary | ICD-10-CM | POA: Diagnosis not present

## 2018-08-09 DIAGNOSIS — L820 Inflamed seborrheic keratosis: Secondary | ICD-10-CM | POA: Diagnosis not present

## 2018-08-09 DIAGNOSIS — D229 Melanocytic nevi, unspecified: Secondary | ICD-10-CM | POA: Diagnosis not present

## 2018-08-09 DIAGNOSIS — C44519 Basal cell carcinoma of skin of other part of trunk: Secondary | ICD-10-CM | POA: Diagnosis not present

## 2018-09-28 DIAGNOSIS — C44519 Basal cell carcinoma of skin of other part of trunk: Secondary | ICD-10-CM | POA: Diagnosis not present

## 2018-10-16 DIAGNOSIS — Z1211 Encounter for screening for malignant neoplasm of colon: Secondary | ICD-10-CM | POA: Diagnosis not present

## 2018-10-16 DIAGNOSIS — N183 Chronic kidney disease, stage 3 (moderate): Secondary | ICD-10-CM | POA: Diagnosis not present

## 2018-10-16 DIAGNOSIS — E785 Hyperlipidemia, unspecified: Secondary | ICD-10-CM | POA: Diagnosis not present

## 2018-10-16 DIAGNOSIS — R7302 Impaired glucose tolerance (oral): Secondary | ICD-10-CM | POA: Diagnosis not present

## 2018-10-16 DIAGNOSIS — F17200 Nicotine dependence, unspecified, uncomplicated: Secondary | ICD-10-CM | POA: Diagnosis not present

## 2018-10-16 DIAGNOSIS — I129 Hypertensive chronic kidney disease with stage 1 through stage 4 chronic kidney disease, or unspecified chronic kidney disease: Secondary | ICD-10-CM | POA: Diagnosis not present

## 2018-12-28 ENCOUNTER — Other Ambulatory Visit: Payer: Self-pay | Admitting: Dermatology

## 2018-12-28 DIAGNOSIS — L7211 Pilar cyst: Secondary | ICD-10-CM | POA: Diagnosis not present

## 2018-12-29 DIAGNOSIS — Z1211 Encounter for screening for malignant neoplasm of colon: Secondary | ICD-10-CM | POA: Diagnosis not present

## 2019-01-02 DIAGNOSIS — M13861 Other specified arthritis, right knee: Secondary | ICD-10-CM | POA: Diagnosis not present

## 2019-01-02 DIAGNOSIS — M25561 Pain in right knee: Secondary | ICD-10-CM | POA: Diagnosis not present

## 2019-01-02 DIAGNOSIS — M25562 Pain in left knee: Secondary | ICD-10-CM | POA: Diagnosis not present

## 2019-01-24 DIAGNOSIS — H9311 Tinnitus, right ear: Secondary | ICD-10-CM | POA: Diagnosis not present

## 2019-01-24 DIAGNOSIS — H9041 Sensorineural hearing loss, unilateral, right ear, with unrestricted hearing on the contralateral side: Secondary | ICD-10-CM | POA: Diagnosis not present

## 2019-07-02 DIAGNOSIS — M25562 Pain in left knee: Secondary | ICD-10-CM | POA: Diagnosis not present

## 2019-07-02 DIAGNOSIS — M25561 Pain in right knee: Secondary | ICD-10-CM | POA: Diagnosis not present

## 2019-08-13 DIAGNOSIS — M25561 Pain in right knee: Secondary | ICD-10-CM | POA: Diagnosis not present

## 2019-08-13 DIAGNOSIS — M170 Bilateral primary osteoarthritis of knee: Secondary | ICD-10-CM | POA: Diagnosis not present

## 2019-08-13 DIAGNOSIS — M25562 Pain in left knee: Secondary | ICD-10-CM | POA: Diagnosis not present

## 2019-08-20 DIAGNOSIS — M25561 Pain in right knee: Secondary | ICD-10-CM | POA: Diagnosis not present

## 2019-08-20 DIAGNOSIS — M25562 Pain in left knee: Secondary | ICD-10-CM | POA: Diagnosis not present

## 2019-08-20 DIAGNOSIS — M170 Bilateral primary osteoarthritis of knee: Secondary | ICD-10-CM | POA: Diagnosis not present

## 2019-08-28 DIAGNOSIS — M170 Bilateral primary osteoarthritis of knee: Secondary | ICD-10-CM | POA: Diagnosis not present

## 2019-08-28 DIAGNOSIS — M25561 Pain in right knee: Secondary | ICD-10-CM | POA: Diagnosis not present

## 2019-08-28 DIAGNOSIS — M25562 Pain in left knee: Secondary | ICD-10-CM | POA: Diagnosis not present

## 2019-09-11 DIAGNOSIS — H52223 Regular astigmatism, bilateral: Secondary | ICD-10-CM | POA: Diagnosis not present

## 2019-09-11 DIAGNOSIS — H25813 Combined forms of age-related cataract, bilateral: Secondary | ICD-10-CM | POA: Diagnosis not present

## 2019-09-11 DIAGNOSIS — H524 Presbyopia: Secondary | ICD-10-CM | POA: Diagnosis not present

## 2019-09-11 DIAGNOSIS — H5203 Hypermetropia, bilateral: Secondary | ICD-10-CM | POA: Diagnosis not present

## 2019-10-10 DIAGNOSIS — M25561 Pain in right knee: Secondary | ICD-10-CM | POA: Diagnosis not present

## 2019-10-10 DIAGNOSIS — M25562 Pain in left knee: Secondary | ICD-10-CM | POA: Diagnosis not present

## 2019-10-11 DIAGNOSIS — E785 Hyperlipidemia, unspecified: Secondary | ICD-10-CM | POA: Diagnosis not present

## 2019-10-11 DIAGNOSIS — R7303 Prediabetes: Secondary | ICD-10-CM | POA: Diagnosis not present

## 2019-10-11 DIAGNOSIS — F17200 Nicotine dependence, unspecified, uncomplicated: Secondary | ICD-10-CM | POA: Diagnosis not present

## 2019-10-11 DIAGNOSIS — D692 Other nonthrombocytopenic purpura: Secondary | ICD-10-CM | POA: Diagnosis not present

## 2019-10-11 DIAGNOSIS — I129 Hypertensive chronic kidney disease with stage 1 through stage 4 chronic kidney disease, or unspecified chronic kidney disease: Secondary | ICD-10-CM | POA: Diagnosis not present

## 2019-10-11 DIAGNOSIS — N183 Chronic kidney disease, stage 3 unspecified: Secondary | ICD-10-CM | POA: Diagnosis not present

## 2019-10-11 DIAGNOSIS — D72820 Lymphocytosis (symptomatic): Secondary | ICD-10-CM | POA: Diagnosis not present

## 2019-10-16 DIAGNOSIS — H6123 Impacted cerumen, bilateral: Secondary | ICD-10-CM | POA: Diagnosis not present

## 2019-12-24 ENCOUNTER — Other Ambulatory Visit: Payer: Self-pay | Admitting: Dermatology

## 2019-12-24 DIAGNOSIS — D485 Neoplasm of uncertain behavior of skin: Secondary | ICD-10-CM | POA: Diagnosis not present

## 2019-12-24 DIAGNOSIS — L820 Inflamed seborrheic keratosis: Secondary | ICD-10-CM | POA: Diagnosis not present

## 2020-03-26 DIAGNOSIS — E785 Hyperlipidemia, unspecified: Secondary | ICD-10-CM | POA: Diagnosis not present

## 2020-03-26 DIAGNOSIS — Z23 Encounter for immunization: Secondary | ICD-10-CM | POA: Diagnosis not present

## 2020-03-26 DIAGNOSIS — F17200 Nicotine dependence, unspecified, uncomplicated: Secondary | ICD-10-CM | POA: Diagnosis not present

## 2020-03-26 DIAGNOSIS — Z1159 Encounter for screening for other viral diseases: Secondary | ICD-10-CM | POA: Diagnosis not present

## 2020-03-26 DIAGNOSIS — Z0000 Encounter for general adult medical examination without abnormal findings: Secondary | ICD-10-CM | POA: Diagnosis not present

## 2020-03-26 DIAGNOSIS — R7303 Prediabetes: Secondary | ICD-10-CM | POA: Diagnosis not present

## 2020-03-26 DIAGNOSIS — I129 Hypertensive chronic kidney disease with stage 1 through stage 4 chronic kidney disease, or unspecified chronic kidney disease: Secondary | ICD-10-CM | POA: Diagnosis not present

## 2020-03-26 DIAGNOSIS — N183 Chronic kidney disease, stage 3 unspecified: Secondary | ICD-10-CM | POA: Diagnosis not present

## 2020-03-26 DIAGNOSIS — H8109 Meniere's disease, unspecified ear: Secondary | ICD-10-CM | POA: Diagnosis not present

## 2020-03-26 DIAGNOSIS — D72820 Lymphocytosis (symptomatic): Secondary | ICD-10-CM | POA: Diagnosis not present

## 2020-04-02 DIAGNOSIS — Z1231 Encounter for screening mammogram for malignant neoplasm of breast: Secondary | ICD-10-CM | POA: Diagnosis not present

## 2020-04-02 DIAGNOSIS — Z1272 Encounter for screening for malignant neoplasm of vagina: Secondary | ICD-10-CM | POA: Diagnosis not present

## 2020-04-02 DIAGNOSIS — Z01419 Encounter for gynecological examination (general) (routine) without abnormal findings: Secondary | ICD-10-CM | POA: Diagnosis not present

## 2020-04-02 DIAGNOSIS — N951 Menopausal and female climacteric states: Secondary | ICD-10-CM | POA: Diagnosis not present

## 2020-04-21 DIAGNOSIS — M25562 Pain in left knee: Secondary | ICD-10-CM | POA: Diagnosis not present

## 2020-04-21 DIAGNOSIS — M170 Bilateral primary osteoarthritis of knee: Secondary | ICD-10-CM | POA: Diagnosis not present

## 2020-04-21 DIAGNOSIS — M25561 Pain in right knee: Secondary | ICD-10-CM | POA: Diagnosis not present

## 2020-04-28 DIAGNOSIS — M25561 Pain in right knee: Secondary | ICD-10-CM | POA: Diagnosis not present

## 2020-04-28 DIAGNOSIS — M25562 Pain in left knee: Secondary | ICD-10-CM | POA: Diagnosis not present

## 2020-04-28 DIAGNOSIS — M170 Bilateral primary osteoarthritis of knee: Secondary | ICD-10-CM | POA: Diagnosis not present

## 2020-05-05 DIAGNOSIS — M25562 Pain in left knee: Secondary | ICD-10-CM | POA: Diagnosis not present

## 2020-05-05 DIAGNOSIS — M170 Bilateral primary osteoarthritis of knee: Secondary | ICD-10-CM | POA: Diagnosis not present

## 2020-05-30 DIAGNOSIS — M25562 Pain in left knee: Secondary | ICD-10-CM | POA: Diagnosis not present

## 2020-05-30 DIAGNOSIS — M25561 Pain in right knee: Secondary | ICD-10-CM | POA: Diagnosis not present

## 2020-06-05 ENCOUNTER — Other Ambulatory Visit: Payer: Self-pay

## 2020-06-05 ENCOUNTER — Ambulatory Visit (INDEPENDENT_AMBULATORY_CARE_PROVIDER_SITE_OTHER): Payer: PPO | Admitting: Otolaryngology

## 2020-06-05 ENCOUNTER — Encounter (INDEPENDENT_AMBULATORY_CARE_PROVIDER_SITE_OTHER): Payer: Self-pay | Admitting: Otolaryngology

## 2020-06-05 VITALS — Temp 97.3°F

## 2020-06-05 DIAGNOSIS — K115 Sialolithiasis: Secondary | ICD-10-CM

## 2020-06-05 NOTE — Progress Notes (Signed)
HPI: Julie Rogers is a 69 y.o. female who returns today for evaluation of intermittent swelling of the right submandibular area that has been going on for the past few weeks whenever she eats grapes.  The swelling occurs but then goes back down.  The area is a little bit firm to palpation when she examines it. She does smoke half a pack a day.  Past Medical History:  Diagnosis Date  . Arthritis   . Hypertension   . Meniere disease    takes maxide  . Seasonal allergies   . Smoker    Past Surgical History:  Procedure Laterality Date  . ABDOMINAL HYSTERECTOMY    . CARPAL TUNNEL RELEASE Right   . CARPAL TUNNEL RELEASE Left 02/11/2017   Procedure: Left Limited open Carpal tunnel release ;  Surgeon: Roseanne Kaufman, MD;  Location: [facility];  Service: Orthopedics;  Laterality: Left;  Requests 60 mins  . MIDDLE EAR SURGERY Right    Dr Ernesto Rutherford  . TRIGGER FINGER RELEASE Right   . TUBAL LIGATION     Social History   Socioeconomic History  . Marital status: Married    Spouse name: Not on file  . Number of children: Not on file  . Years of education: Not on file  . Highest education level: Not on file  Occupational History  . Not on file  Tobacco Use  . Smoking status: Current Every Day Smoker    Packs/day: 0.50    Years: 51.00    Pack years: 25.50    Start date: 67  . Smokeless tobacco: Never Used  Vaping Use  . Vaping Use: Never used  Substance and Sexual Activity  . Alcohol use: Yes    Comment: social  . Drug use: Not on file  . Sexual activity: Not on file  Other Topics Concern  . Not on file  Social History Narrative  . Not on file   Social Determinants of Health   Financial Resource Strain:   . Difficulty of Paying Living Expenses:   Food Insecurity:   . Worried About Charity fundraiser in the Last Year:   . Arboriculturist in the Last Year:   Transportation Needs:   . Film/video editor (Medical):   Marland Kitchen Lack of Transportation  (Non-Medical):   Physical Activity:   . Days of Exercise per Week:   . Minutes of Exercise per Session:   Stress:   . Feeling of Stress :   Social Connections:   . Frequency of Communication with Friends and Family:   . Frequency of Social Gatherings with Friends and Family:   . Attends Religious Services:   . Active Member of Clubs or Organizations:   . Attends Archivist Meetings:   Marland Kitchen Marital Status:    No family history on file. No Known Allergies Prior to Admission medications   Medication Sig Start Date End Date Taking? Authorizing Provider  Cholecalciferol (VITAMIN D3) 2000 units TABS Take by mouth.   Yes [provider]  estradiol (VIVELLE-DOT) 0.1 MG/24HR patch Place 1 patch onto the skin 2 (two) times a week.   Yes [provider]  fexofenadine (ALLEGRA) 180 MG tablet Take 180 mg by mouth daily.   Yes [provider]  losartan (COZAAR) 25 MG tablet Take 25 mg by mouth daily.   Yes [provider]  triamterene-hydrochlorothiazide (MAXZIDE-25) 37.5-25 MG tablet Take 1 tablet by mouth daily.   Yes [provider]  Positive ROS: Otherwise negative  All other systems have been reviewed and were otherwise negative with the exception of those mentioned in the HPI and as above.  Physical Exam: Constitutional: Alert, well-appearing, no acute distress Ears: External ears without lesions or tenderness. Ear canals are clear bilaterally with intact, clear TMs.  Nasal: External nose without lesions.. Clear nasal passages Oral: Lips and gums without lesions. Tongue and palate mucosa without lesions. Posterior oropharynx clear.  Drainage from the right semitubular duct is clear.  On bimanual palpation she has a large right submandibular gland compared to the left.  When she ate grapes in the office today she has slight swelling of this area with clear drainage from the submandibular duct. Neck: No palpable adenopathy or masses.   Slightly enlarged and firm right submandibular gland compared to the left.  No palpable adenopathy. Respiratory: Breathing comfortably  Skin: No facial/neck lesions or rash noted.  Procedures  Assessment: Partial right submandibular obstruction with probable calculi.  Plan: Recommended use of sialagogues as well as drinking plenty liquids to try to flush this area. Gave a prescription for Keflex 500 mg 3 times daily for 10 days if she develops any persistent swelling. I discussed with her that if problems persist may need surgical intervention. I am unable to palpate a calculi within the duct in the floor the mouth. She will notify us if symptoms worsen.   Radene Journey, MD

## 2020-06-12 DIAGNOSIS — M25562 Pain in left knee: Secondary | ICD-10-CM | POA: Diagnosis not present

## 2020-06-18 DIAGNOSIS — M25562 Pain in left knee: Secondary | ICD-10-CM | POA: Diagnosis not present

## 2020-07-09 DIAGNOSIS — M170 Bilateral primary osteoarthritis of knee: Secondary | ICD-10-CM | POA: Diagnosis not present

## 2020-07-09 DIAGNOSIS — M25562 Pain in left knee: Secondary | ICD-10-CM | POA: Diagnosis not present

## 2020-07-09 DIAGNOSIS — M1712 Unilateral primary osteoarthritis, left knee: Secondary | ICD-10-CM | POA: Diagnosis not present

## 2020-07-09 DIAGNOSIS — M1711 Unilateral primary osteoarthritis, right knee: Secondary | ICD-10-CM | POA: Diagnosis not present

## 2020-07-09 DIAGNOSIS — M25561 Pain in right knee: Secondary | ICD-10-CM | POA: Diagnosis not present

## 2020-07-16 ENCOUNTER — Telehealth (INDEPENDENT_AMBULATORY_CARE_PROVIDER_SITE_OTHER): Payer: Self-pay

## 2020-07-31 ENCOUNTER — Encounter (INDEPENDENT_AMBULATORY_CARE_PROVIDER_SITE_OTHER): Payer: Self-pay | Admitting: Otolaryngology

## 2020-07-31 ENCOUNTER — Other Ambulatory Visit: Payer: Self-pay

## 2020-07-31 ENCOUNTER — Ambulatory Visit (INDEPENDENT_AMBULATORY_CARE_PROVIDER_SITE_OTHER): Payer: PPO | Admitting: Otolaryngology

## 2020-07-31 VITALS — Temp 97.2°F

## 2020-07-31 DIAGNOSIS — K115 Sialolithiasis: Secondary | ICD-10-CM

## 2020-07-31 DIAGNOSIS — H9311 Tinnitus, right ear: Secondary | ICD-10-CM

## 2020-07-31 NOTE — Progress Notes (Signed)
HPI: Julie Rogers is a 69 y.o. female who returns today for evaluation of recurrent right semitubular gland swelling.  She took the antibiotics which helps some with a "tin" taste in her mouth that is now resolved.  But she continues to have recurrent swelling of the right semitubular gland that goes down after an hour or 2.  She has had no persistent swelling. 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  She wears a cross hearing aid as she has normal hearing in the left ear.  She also takes Maxide for Mnire's disease that has been prescribed by Dr. Ernesto Rutherford.  Past Medical History:  Diagnosis Date  . Arthritis   . Hypertension   . Meniere disease    takes maxide  . Seasonal allergies   . Smoker    Past Surgical History:  Procedure Laterality Date  . ABDOMINAL HYSTERECTOMY    . CARPAL TUNNEL RELEASE Right   . CARPAL TUNNEL RELEASE Left 02/11/2017   Procedure: Left Limited open Carpal tunnel release ;  Surgeon: Roseanne Kaufman, MD;  Location: [facility];  Service: Orthopedics;  Laterality: Left;  Requests 60 mins  . MIDDLE EAR SURGERY Right    Dr Ernesto Rutherford  . TRIGGER FINGER RELEASE Right   . TUBAL LIGATION     Social History   Socioeconomic History  . Marital status: Married    Spouse name: Not on file  . Number of children: Not on file  . Years of education: Not on file  . Highest education level: Not on file  Occupational History  . Not on file  Tobacco Use  . Smoking status: Current Every Day Smoker    Packs/day: 0.50    Years: 51.00    Pack years: 25.50    Start date: 47  . Smokeless tobacco: Never Used  Vaping Use  . Vaping Use: Never used  Substance and Sexual Activity  . Alcohol use: Yes    Comment: social  . Drug use: Not on file  . Sexual activity: Not on file  Other Topics Concern  . Not on file  Social History Narrative  . Not on file   Social Determinants of Health    Financial Resource Strain:   . Difficulty of Paying Living Expenses:   Food Insecurity:   . Worried About Charity fundraiser in the Last Year:   . Arboriculturist in the Last Year:   Transportation Needs:   . Film/video editor (Medical):   Marland Kitchen Lack of Transportation (Non-Medical):   Physical Activity:   . Days of Exercise per Week:   . Minutes of Exercise per Session:   Stress:   . Feeling of Stress :   Social Connections:   . Frequency of Communication with Friends and Family:   . Frequency of Social Gatherings with Friends and Family:   . Attends Religious Services:   . Active Member of Clubs or Organizations:   . Attends Archivist Meetings:   Marland Kitchen Marital Status:    No family history on file. No Known Allergies Prior to Admission medications   Medication Sig Start Date End Date Taking? Authorizing Provider  Cholecalciferol (VITAMIN D3) 2000 units TABS Take by mouth.   Yes [provider]  estradiol (VIVELLE-DOT) 0.1 MG/24HR patch Place 1 patch onto the skin 2 (two) times a week.   Yes [provider]  fexofenadine Delma Freeze)  180 MG tablet Take 180 mg by mouth daily.   Yes [provider]  losartan (COZAAR) 25 MG tablet Take 25 mg by mouth daily.   Yes [provider]  triamterene-hydrochlorothiazide (MAXZIDE-25) 37.5-25 MG tablet Take 1 tablet by mouth daily.   Yes [provider]     Positive ROS: Otherwise negative  All other systems have been reviewed and were otherwise negative with the exception of those mentioned in the HPI and as above.  Physical Exam: Constitutional: Alert, well-appearing, no acute distress Ears: External ears without lesions or tenderness. Ear canals are clear bilaterally with intact, clear TMs.  Nasal: External nose without lesions. Clear nasal passages Oral: Lips and gums without lesions. Tongue and palate mucosa without lesions. Posterior oropharynx clear.  Clear drainage from  submandibular ducts.  No floor mouth lesions noted on palpation.  Bimanual palpation of the right semitubular gland is slightly enlarged but no palpable masses or tenderness Neck: No palpable adenopathy or masses. Respiratory: Breathing comfortably  Skin: No facial/neck lesions or rash noted.  Procedures  Assessment: Partial obstruction of right semitubular gland. Right ear SNHL with secondary tinnitus. History of Mnire's disease.  Plan: Reviewed with her concerning options for the right submandibular gland.  At this point it is not giving her that much difficulty and she will just put up with it.  She has tried sialagogues. Briefly discussed surgical options if problems persist but she is not interested in surgery at this time. Also gave her Lipo flavonoid to try for the right ear tinnitus. She will follow-up as needed.   Radene Journey, MD

## 2020-08-21 DIAGNOSIS — L03032 Cellulitis of left toe: Secondary | ICD-10-CM | POA: Diagnosis not present

## 2020-08-21 DIAGNOSIS — L600 Ingrowing nail: Secondary | ICD-10-CM | POA: Diagnosis not present

## 2020-09-02 DIAGNOSIS — M25561 Pain in right knee: Secondary | ICD-10-CM | POA: Diagnosis not present

## 2020-09-02 DIAGNOSIS — M170 Bilateral primary osteoarthritis of knee: Secondary | ICD-10-CM | POA: Diagnosis not present

## 2020-09-02 DIAGNOSIS — M25562 Pain in left knee: Secondary | ICD-10-CM | POA: Diagnosis not present

## 2020-09-05 DIAGNOSIS — L600 Ingrowing nail: Secondary | ICD-10-CM | POA: Diagnosis not present

## 2020-09-05 DIAGNOSIS — M79674 Pain in right toe(s): Secondary | ICD-10-CM | POA: Diagnosis not present

## 2020-12-02 DIAGNOSIS — M1712 Unilateral primary osteoarthritis, left knee: Secondary | ICD-10-CM | POA: Diagnosis not present

## 2020-12-02 DIAGNOSIS — M1711 Unilateral primary osteoarthritis, right knee: Secondary | ICD-10-CM | POA: Diagnosis not present

## 2020-12-02 DIAGNOSIS — M170 Bilateral primary osteoarthritis of knee: Secondary | ICD-10-CM | POA: Diagnosis not present

## 2021-03-10 DIAGNOSIS — M1712 Unilateral primary osteoarthritis, left knee: Secondary | ICD-10-CM | POA: Diagnosis not present

## 2021-03-10 DIAGNOSIS — M170 Bilateral primary osteoarthritis of knee: Secondary | ICD-10-CM | POA: Diagnosis not present

## 2021-03-10 DIAGNOSIS — M1711 Unilateral primary osteoarthritis, right knee: Secondary | ICD-10-CM | POA: Diagnosis not present

## 2021-03-24 DIAGNOSIS — R7303 Prediabetes: Secondary | ICD-10-CM | POA: Diagnosis not present

## 2021-03-24 DIAGNOSIS — N183 Chronic kidney disease, stage 3 unspecified: Secondary | ICD-10-CM | POA: Diagnosis not present

## 2021-03-24 DIAGNOSIS — I129 Hypertensive chronic kidney disease with stage 1 through stage 4 chronic kidney disease, or unspecified chronic kidney disease: Secondary | ICD-10-CM | POA: Diagnosis not present

## 2021-03-24 DIAGNOSIS — E785 Hyperlipidemia, unspecified: Secondary | ICD-10-CM | POA: Diagnosis not present

## 2021-03-24 DIAGNOSIS — D72820 Lymphocytosis (symptomatic): Secondary | ICD-10-CM | POA: Diagnosis not present

## 2021-04-14 ENCOUNTER — Ambulatory Visit: Payer: PPO | Admitting: Dermatology

## 2021-04-14 ENCOUNTER — Other Ambulatory Visit: Payer: Self-pay

## 2021-04-14 ENCOUNTER — Encounter: Payer: Self-pay | Admitting: Dermatology

## 2021-04-14 DIAGNOSIS — L570 Actinic keratosis: Secondary | ICD-10-CM

## 2021-04-14 DIAGNOSIS — D692 Other nonthrombocytopenic purpura: Secondary | ICD-10-CM

## 2021-04-14 MED ORDER — DERMEND BRUISE FORMULA EX CREA: 1.0000 "application " | TOPICAL_CREAM | Freq: Every evening | CUTANEOUS | 0 refills | Status: DC

## 2021-04-14 NOTE — Patient Instructions (Addendum)
Get OTC Dermend Cream for left forearm

## 2021-04-25 ENCOUNTER — Encounter: Payer: Self-pay | Admitting: Dermatology

## 2021-04-25 NOTE — Progress Notes (Signed)
   Follow-Up Visit   Subjective  Julie Rogers is a 70 y.o. female who presents for the following: Skin Problem (Left nose crusty lesion and two other red spots on her nose x moths ).  Crust on nose, bruising arm Location:  Duration:  Quality:  Associated Signs/Symptoms: Modifying Factors:  Severity:  Timing: Context:   Objective  Well appearing patient in no apparent distress; mood and affect are within normal limits. Objective  Left Forearm - Posterior: Three 1 cm ecchymoses forearm with no history of abnormal bleeding elsewhere.  Objective  Left Tip of Nose: Gritty 4 mm crusts    A focused examination was performed including Head, neck, arms.. Relevant physical exam findings are noted in the Assessment and Plan.   Assessment & Plan    Solar purpura [facility]) Left Forearm - Posterior  May try over-the-counter Dermend cream daily after bathing to see if it will reduce skin fragility.  Emollient (DERMEND BRUISE FORMULA) CREA - Left Forearm - Posterior  Actinic keratosis Left Tip of Nose  Not optimal timing for patient to have spots frozen; defer treatment at this time, patient will follow up in the Fall.      I, Lavonna Monarch, MD, have reviewed all documentation for this visit.  The documentation on 04/25/21 for the exam, diagnosis, procedures, and orders are all accurate and complete.

## 2021-05-06 DIAGNOSIS — Z1231 Encounter for screening mammogram for malignant neoplasm of breast: Secondary | ICD-10-CM | POA: Diagnosis not present

## 2021-06-11 DIAGNOSIS — M25562 Pain in left knee: Secondary | ICD-10-CM | POA: Diagnosis not present

## 2021-06-11 DIAGNOSIS — M25561 Pain in right knee: Secondary | ICD-10-CM | POA: Diagnosis not present

## 2021-06-15 ENCOUNTER — Other Ambulatory Visit (INDEPENDENT_AMBULATORY_CARE_PROVIDER_SITE_OTHER): Payer: Self-pay | Admitting: Otolaryngology

## 2021-06-25 DIAGNOSIS — J019 Acute sinusitis, unspecified: Secondary | ICD-10-CM | POA: Diagnosis not present

## 2021-07-15 DIAGNOSIS — N183 Chronic kidney disease, stage 3 unspecified: Secondary | ICD-10-CM | POA: Diagnosis not present

## 2021-07-15 DIAGNOSIS — I129 Hypertensive chronic kidney disease with stage 1 through stage 4 chronic kidney disease, or unspecified chronic kidney disease: Secondary | ICD-10-CM | POA: Diagnosis not present

## 2021-07-15 DIAGNOSIS — D72820 Lymphocytosis (symptomatic): Secondary | ICD-10-CM | POA: Diagnosis not present

## 2021-07-15 DIAGNOSIS — F17200 Nicotine dependence, unspecified, uncomplicated: Secondary | ICD-10-CM | POA: Diagnosis not present

## 2021-07-15 DIAGNOSIS — H8109 Meniere's disease, unspecified ear: Secondary | ICD-10-CM | POA: Diagnosis not present

## 2021-07-15 DIAGNOSIS — E785 Hyperlipidemia, unspecified: Secondary | ICD-10-CM | POA: Diagnosis not present

## 2021-07-15 DIAGNOSIS — R7303 Prediabetes: Secondary | ICD-10-CM | POA: Diagnosis not present

## 2021-07-15 DIAGNOSIS — Z0000 Encounter for general adult medical examination without abnormal findings: Secondary | ICD-10-CM | POA: Diagnosis not present

## 2021-08-10 ENCOUNTER — Ambulatory Visit: Payer: PPO | Admitting: Dermatology

## 2021-08-10 ENCOUNTER — Other Ambulatory Visit: Payer: Self-pay

## 2021-08-10 ENCOUNTER — Encounter: Payer: Self-pay | Admitting: Dermatology

## 2021-08-10 DIAGNOSIS — L570 Actinic keratosis: Secondary | ICD-10-CM

## 2021-08-21 ENCOUNTER — Encounter: Payer: Self-pay | Admitting: Dermatology

## 2021-08-21 NOTE — Progress Notes (Signed)
   Follow-Up Visit   Subjective  Julie Rogers is a 70 y.o. female who presents for the following: Follow-up (Left nare lesion crusty and still present no previous treatment it was deferred till fall ).  Check face, crusty spot on nose Location:  Duration:  Quality:  Associated Signs/Symptoms: Modifying Factors:  Severity:  Timing: Context:   Objective  Well appearing patient in no apparent distress; mood and affect are within normal limits. Left Nasal Sidewall Subtle 3 mm waxy pink crust, more likely actinic keratosis than superficial carcinoma.    A focused examination was performed including head and neck.. Relevant physical exam findings are noted in the Assessment and Plan.   Assessment & Plan    AK (actinic keratosis) Left Nasal Sidewall  If freezing fails, she will return for possible biopsy.  Destruction of lesion - Left Nasal Sidewall Complexity: simple   Destruction method: cryotherapy   Informed consent: discussed and consent obtained   Timeout:  patient name, date of birth, surgical site, and procedure verified Lesion destroyed using liquid nitrogen: Yes   Cryotherapy cycles:  3 Outcome: patient tolerated procedure well with no complications   Post-procedure details: wound care instructions given        I, Lavonna Monarch, MD, have reviewed all documentation for this visit.  The documentation on 08/21/21 for the exam, diagnosis, procedures, and orders are all accurate and complete.

## 2021-09-10 DIAGNOSIS — M1711 Unilateral primary osteoarthritis, right knee: Secondary | ICD-10-CM | POA: Diagnosis not present

## 2021-09-10 DIAGNOSIS — M1712 Unilateral primary osteoarthritis, left knee: Secondary | ICD-10-CM | POA: Diagnosis not present

## 2021-09-10 DIAGNOSIS — M170 Bilateral primary osteoarthritis of knee: Secondary | ICD-10-CM | POA: Diagnosis not present

## 2021-09-21 DIAGNOSIS — Z01818 Encounter for other preprocedural examination: Secondary | ICD-10-CM | POA: Diagnosis not present

## 2021-09-21 DIAGNOSIS — N183 Chronic kidney disease, stage 3 unspecified: Secondary | ICD-10-CM | POA: Diagnosis not present

## 2021-09-21 DIAGNOSIS — I129 Hypertensive chronic kidney disease with stage 1 through stage 4 chronic kidney disease, or unspecified chronic kidney disease: Secondary | ICD-10-CM | POA: Diagnosis not present

## 2021-09-21 DIAGNOSIS — M1710 Unilateral primary osteoarthritis, unspecified knee: Secondary | ICD-10-CM | POA: Diagnosis not present

## 2021-10-07 DIAGNOSIS — M1712 Unilateral primary osteoarthritis, left knee: Secondary | ICD-10-CM | POA: Diagnosis not present

## 2021-10-14 ENCOUNTER — Ambulatory Visit: Payer: PPO | Admitting: Dermatology

## 2021-11-02 DIAGNOSIS — M1712 Unilateral primary osteoarthritis, left knee: Secondary | ICD-10-CM | POA: Diagnosis not present

## 2021-11-02 DIAGNOSIS — G8918 Other acute postprocedural pain: Secondary | ICD-10-CM | POA: Diagnosis not present

## 2021-11-02 DIAGNOSIS — Z96652 Presence of left artificial knee joint: Secondary | ICD-10-CM | POA: Diagnosis not present

## 2021-11-06 DIAGNOSIS — M25562 Pain in left knee: Secondary | ICD-10-CM | POA: Diagnosis not present

## 2021-11-09 DIAGNOSIS — M25562 Pain in left knee: Secondary | ICD-10-CM | POA: Diagnosis not present

## 2021-11-11 DIAGNOSIS — M25562 Pain in left knee: Secondary | ICD-10-CM | POA: Diagnosis not present

## 2021-11-16 DIAGNOSIS — M25562 Pain in left knee: Secondary | ICD-10-CM | POA: Diagnosis not present

## 2021-11-19 DIAGNOSIS — Z4789 Encounter for other orthopedic aftercare: Secondary | ICD-10-CM | POA: Diagnosis not present

## 2021-11-20 DIAGNOSIS — M25562 Pain in left knee: Secondary | ICD-10-CM | POA: Diagnosis not present

## 2021-11-23 DIAGNOSIS — M25562 Pain in left knee: Secondary | ICD-10-CM | POA: Diagnosis not present

## 2021-11-25 DIAGNOSIS — M25562 Pain in left knee: Secondary | ICD-10-CM | POA: Diagnosis not present

## 2021-11-27 DIAGNOSIS — M25562 Pain in left knee: Secondary | ICD-10-CM | POA: Diagnosis not present

## 2021-11-30 ENCOUNTER — Other Ambulatory Visit: Payer: Self-pay

## 2021-11-30 ENCOUNTER — Encounter: Payer: Self-pay | Admitting: Dermatology

## 2021-11-30 ENCOUNTER — Ambulatory Visit: Payer: PPO | Admitting: Dermatology

## 2021-11-30 DIAGNOSIS — R21 Rash and other nonspecific skin eruption: Secondary | ICD-10-CM | POA: Diagnosis not present

## 2021-11-30 DIAGNOSIS — M25562 Pain in left knee: Secondary | ICD-10-CM | POA: Diagnosis not present

## 2021-11-30 MED ORDER — CLOBETASOL PROPIONATE 0.05 % EX CREA: 1.0000 "application " | TOPICAL_CREAM | Freq: Two times a day (BID) | CUTANEOUS | 0 refills | Status: AC

## 2021-12-03 DIAGNOSIS — M25562 Pain in left knee: Secondary | ICD-10-CM | POA: Diagnosis not present

## 2021-12-08 DIAGNOSIS — M25562 Pain in left knee: Secondary | ICD-10-CM | POA: Diagnosis not present

## 2021-12-11 DIAGNOSIS — M25562 Pain in left knee: Secondary | ICD-10-CM | POA: Diagnosis not present

## 2021-12-15 DIAGNOSIS — M25562 Pain in left knee: Secondary | ICD-10-CM | POA: Diagnosis not present

## 2021-12-17 DIAGNOSIS — M25562 Pain in left knee: Secondary | ICD-10-CM | POA: Diagnosis not present

## 2021-12-20 ENCOUNTER — Encounter: Payer: Self-pay | Admitting: Dermatology

## 2021-12-20 NOTE — Progress Notes (Signed)
° °  Follow-Up Visit   Subjective  Julie Rogers is a 71 y.o. female who presents for the following: Skin Problem (Patients states she has lesion on left shin and calf x couple days.. Itching around lesion. No household changes and no one else has it.  Per patient knee replacement 4 weeks ago. Per patient has been taking hydrocodone and robaxin following knee replacement. Patient has been using over the counter hydrocortisone.).  New onset rash on same leg as knee prosthesis four weeks ago. Location:  Duration:  Quality:  Associated Signs/Symptoms: Modifying Factors:  Severity:  Timing: Context:   Objective  Well appearing patient in no apparent distress; mood and affect are within normal limits. Left Lower Leg - Anterior Granulomatous subtly raised dusky pink patch covering lower 1/3 left calf plus 1.5cm similar spot left medical mid-shin. No margination. No sign tinea on feet. No invovlement right leg. This would best fit an atypical granuloma annulare or NLD, but the onset ipsilaterally four weeks after knee replacement certainly brings up the possibility of an exogenous "trigger."          A focused examination was performed including arms, legs, feet, nails.. Relevant physical exam findings are noted in the Assessment and Plan.   Assessment & Plan    Rash and other nonspecific skin eruption Left Lower Leg - Anterior  We discussed obtaining biopsies but the very recent onset might predict response to a potent topical corticoid. Rx Clobetasol Cr BID x 3wks, good to apply post bathing and covering the cream with a moist wrap for 20-30 minutes might help. Recheck at that time.  Related Medications clobetasol cream (TEMOVATE) 2.84 % Apply 1 application topically 2 (two) times daily. Apply to affected area 2 times daily for 3 wks      I, Lavonna Monarch, MD, have reviewed all documentation for this visit.  The documentation on 12/20/21 for the exam, diagnosis,  procedures, and orders are all accurate and complete.

## 2021-12-22 DIAGNOSIS — M25511 Pain in right shoulder: Secondary | ICD-10-CM | POA: Diagnosis not present

## 2021-12-22 DIAGNOSIS — Z4789 Encounter for other orthopedic aftercare: Secondary | ICD-10-CM | POA: Diagnosis not present

## 2021-12-22 DIAGNOSIS — M25561 Pain in right knee: Secondary | ICD-10-CM | POA: Diagnosis not present

## 2021-12-30 DIAGNOSIS — M25562 Pain in left knee: Secondary | ICD-10-CM | POA: Diagnosis not present

## 2022-01-06 DIAGNOSIS — M25562 Pain in left knee: Secondary | ICD-10-CM | POA: Diagnosis not present

## 2022-01-13 DIAGNOSIS — M25562 Pain in left knee: Secondary | ICD-10-CM | POA: Diagnosis not present

## 2022-01-20 DIAGNOSIS — M25562 Pain in left knee: Secondary | ICD-10-CM | POA: Diagnosis not present

## 2022-02-18 DIAGNOSIS — M1712 Unilateral primary osteoarthritis, left knee: Secondary | ICD-10-CM | POA: Diagnosis not present

## 2022-02-18 DIAGNOSIS — M7541 Impingement syndrome of right shoulder: Secondary | ICD-10-CM | POA: Diagnosis not present

## 2022-02-18 DIAGNOSIS — M25561 Pain in right knee: Secondary | ICD-10-CM | POA: Diagnosis not present

## 2022-04-20 DIAGNOSIS — H5203 Hypermetropia, bilateral: Secondary | ICD-10-CM | POA: Diagnosis not present

## 2022-04-20 DIAGNOSIS — H52223 Regular astigmatism, bilateral: Secondary | ICD-10-CM | POA: Diagnosis not present

## 2022-04-20 DIAGNOSIS — H25819 Combined forms of age-related cataract, unspecified eye: Secondary | ICD-10-CM | POA: Diagnosis not present

## 2022-04-20 DIAGNOSIS — H524 Presbyopia: Secondary | ICD-10-CM | POA: Diagnosis not present

## 2022-04-20 DIAGNOSIS — G245 Blepharospasm: Secondary | ICD-10-CM | POA: Diagnosis not present

## 2022-04-26 ENCOUNTER — Ambulatory Visit: Payer: PPO | Admitting: Dermatology

## 2022-05-27 DIAGNOSIS — Z01419 Encounter for gynecological examination (general) (routine) without abnormal findings: Secondary | ICD-10-CM | POA: Diagnosis not present

## 2022-05-27 DIAGNOSIS — Z1231 Encounter for screening mammogram for malignant neoplasm of breast: Secondary | ICD-10-CM | POA: Diagnosis not present

## 2022-06-16 DIAGNOSIS — M1711 Unilateral primary osteoarthritis, right knee: Secondary | ICD-10-CM | POA: Diagnosis not present

## 2022-06-16 DIAGNOSIS — M25561 Pain in right knee: Secondary | ICD-10-CM | POA: Diagnosis not present

## 2022-07-14 DIAGNOSIS — N183 Chronic kidney disease, stage 3 unspecified: Secondary | ICD-10-CM | POA: Diagnosis not present

## 2022-07-14 DIAGNOSIS — I129 Hypertensive chronic kidney disease with stage 1 through stage 4 chronic kidney disease, or unspecified chronic kidney disease: Secondary | ICD-10-CM | POA: Diagnosis not present

## 2022-07-14 DIAGNOSIS — E785 Hyperlipidemia, unspecified: Secondary | ICD-10-CM | POA: Diagnosis not present

## 2022-07-14 DIAGNOSIS — R7303 Prediabetes: Secondary | ICD-10-CM | POA: Diagnosis not present

## 2022-07-14 DIAGNOSIS — D72820 Lymphocytosis (symptomatic): Secondary | ICD-10-CM | POA: Diagnosis not present

## 2022-07-19 ENCOUNTER — Other Ambulatory Visit: Payer: Self-pay | Admitting: Family Medicine

## 2022-07-19 DIAGNOSIS — M1710 Unilateral primary osteoarthritis, unspecified knee: Secondary | ICD-10-CM | POA: Diagnosis not present

## 2022-07-19 DIAGNOSIS — D692 Other nonthrombocytopenic purpura: Secondary | ICD-10-CM | POA: Diagnosis not present

## 2022-07-19 DIAGNOSIS — I129 Hypertensive chronic kidney disease with stage 1 through stage 4 chronic kidney disease, or unspecified chronic kidney disease: Secondary | ICD-10-CM | POA: Diagnosis not present

## 2022-07-19 DIAGNOSIS — Z0000 Encounter for general adult medical examination without abnormal findings: Secondary | ICD-10-CM | POA: Diagnosis not present

## 2022-07-19 DIAGNOSIS — M8588 Other specified disorders of bone density and structure, other site: Secondary | ICD-10-CM | POA: Diagnosis not present

## 2022-07-19 DIAGNOSIS — D72820 Lymphocytosis (symptomatic): Secondary | ICD-10-CM | POA: Diagnosis not present

## 2022-07-19 DIAGNOSIS — H8109 Meniere's disease, unspecified ear: Secondary | ICD-10-CM | POA: Diagnosis not present

## 2022-07-19 DIAGNOSIS — N183 Chronic kidney disease, stage 3 unspecified: Secondary | ICD-10-CM | POA: Diagnosis not present

## 2022-07-19 DIAGNOSIS — E785 Hyperlipidemia, unspecified: Secondary | ICD-10-CM | POA: Diagnosis not present

## 2022-07-19 DIAGNOSIS — Z8249 Family history of ischemic heart disease and other diseases of the circulatory system: Secondary | ICD-10-CM

## 2022-07-19 DIAGNOSIS — R7303 Prediabetes: Secondary | ICD-10-CM | POA: Diagnosis not present

## 2022-07-19 DIAGNOSIS — R600 Localized edema: Secondary | ICD-10-CM | POA: Diagnosis not present

## 2022-08-17 DIAGNOSIS — M7541 Impingement syndrome of right shoulder: Secondary | ICD-10-CM | POA: Diagnosis not present

## 2022-08-17 DIAGNOSIS — M170 Bilateral primary osteoarthritis of knee: Secondary | ICD-10-CM | POA: Diagnosis not present

## 2022-08-17 DIAGNOSIS — M25561 Pain in right knee: Secondary | ICD-10-CM | POA: Diagnosis not present

## 2022-09-06 DIAGNOSIS — M79672 Pain in left foot: Secondary | ICD-10-CM | POA: Diagnosis not present

## 2022-09-15 DIAGNOSIS — M25561 Pain in right knee: Secondary | ICD-10-CM | POA: Diagnosis not present

## 2022-11-25 DIAGNOSIS — M25561 Pain in right knee: Secondary | ICD-10-CM | POA: Diagnosis not present

## 2022-11-25 DIAGNOSIS — M25511 Pain in right shoulder: Secondary | ICD-10-CM | POA: Diagnosis not present

## 2022-11-25 DIAGNOSIS — M170 Bilateral primary osteoarthritis of knee: Secondary | ICD-10-CM | POA: Diagnosis not present

## 2023-01-03 DIAGNOSIS — M25561 Pain in right knee: Secondary | ICD-10-CM | POA: Diagnosis not present

## 2023-01-03 DIAGNOSIS — M25511 Pain in right shoulder: Secondary | ICD-10-CM | POA: Diagnosis not present

## 2023-02-24 DIAGNOSIS — M1711 Unilateral primary osteoarthritis, right knee: Secondary | ICD-10-CM | POA: Diagnosis not present

## 2023-03-03 ENCOUNTER — Other Ambulatory Visit: Payer: Self-pay | Admitting: Physician Assistant

## 2023-03-03 ENCOUNTER — Ambulatory Visit
Admission: RE | Admit: 2023-03-03 | Discharge: 2023-03-03 | Disposition: A | Discharge status: Home / Self Care | Payer: PPO | Source: Ambulatory Visit | Attending: Physician Assistant | Admitting: Physician Assistant

## 2023-03-03 DIAGNOSIS — R7303 Prediabetes: Secondary | ICD-10-CM | POA: Diagnosis not present

## 2023-03-03 DIAGNOSIS — N183 Chronic kidney disease, stage 3 unspecified: Secondary | ICD-10-CM | POA: Diagnosis not present

## 2023-03-03 DIAGNOSIS — I700 Atherosclerosis of aorta: Secondary | ICD-10-CM | POA: Diagnosis not present

## 2023-03-03 DIAGNOSIS — Z01818 Encounter for other preprocedural examination: Secondary | ICD-10-CM

## 2023-03-03 DIAGNOSIS — M1711 Unilateral primary osteoarthritis, right knee: Secondary | ICD-10-CM | POA: Diagnosis not present

## 2023-03-03 DIAGNOSIS — F17200 Nicotine dependence, unspecified, uncomplicated: Secondary | ICD-10-CM | POA: Diagnosis not present

## 2023-03-03 DIAGNOSIS — E785 Hyperlipidemia, unspecified: Secondary | ICD-10-CM | POA: Diagnosis not present

## 2023-03-03 DIAGNOSIS — I129 Hypertensive chronic kidney disease with stage 1 through stage 4 chronic kidney disease, or unspecified chronic kidney disease: Secondary | ICD-10-CM | POA: Diagnosis not present

## 2023-03-03 DIAGNOSIS — M47814 Spondylosis without myelopathy or radiculopathy, thoracic region: Secondary | ICD-10-CM | POA: Diagnosis not present

## 2023-03-16 DIAGNOSIS — M25511 Pain in right shoulder: Secondary | ICD-10-CM | POA: Diagnosis not present

## 2023-03-21 HISTORY — PX: REPLACEMENT TOTAL KNEE: SUR1224

## 2023-03-22 DIAGNOSIS — Z6834 Body mass index (BMI) 34.0-34.9, adult: Secondary | ICD-10-CM | POA: Diagnosis not present

## 2023-03-22 DIAGNOSIS — J0140 Acute pansinusitis, unspecified: Secondary | ICD-10-CM | POA: Diagnosis not present

## 2023-04-18 DIAGNOSIS — G8918 Other acute postprocedural pain: Secondary | ICD-10-CM | POA: Diagnosis not present

## 2023-04-18 DIAGNOSIS — M1711 Unilateral primary osteoarthritis, right knee: Secondary | ICD-10-CM | POA: Diagnosis not present

## 2023-04-20 DIAGNOSIS — M6281 Muscle weakness (generalized): Secondary | ICD-10-CM | POA: Diagnosis not present

## 2023-04-20 DIAGNOSIS — M25661 Stiffness of right knee, not elsewhere classified: Secondary | ICD-10-CM | POA: Diagnosis not present

## 2023-04-20 DIAGNOSIS — M25561 Pain in right knee: Secondary | ICD-10-CM | POA: Diagnosis not present

## 2023-04-21 DIAGNOSIS — M6281 Muscle weakness (generalized): Secondary | ICD-10-CM | POA: Diagnosis not present

## 2023-04-21 DIAGNOSIS — M25561 Pain in right knee: Secondary | ICD-10-CM | POA: Diagnosis not present

## 2023-04-21 DIAGNOSIS — M25661 Stiffness of right knee, not elsewhere classified: Secondary | ICD-10-CM | POA: Diagnosis not present

## 2023-04-25 DIAGNOSIS — M25661 Stiffness of right knee, not elsewhere classified: Secondary | ICD-10-CM | POA: Diagnosis not present

## 2023-04-25 DIAGNOSIS — M6281 Muscle weakness (generalized): Secondary | ICD-10-CM | POA: Diagnosis not present

## 2023-04-27 DIAGNOSIS — M25661 Stiffness of right knee, not elsewhere classified: Secondary | ICD-10-CM | POA: Diagnosis not present

## 2023-04-27 DIAGNOSIS — M6281 Muscle weakness (generalized): Secondary | ICD-10-CM | POA: Diagnosis not present

## 2023-04-29 DIAGNOSIS — M6281 Muscle weakness (generalized): Secondary | ICD-10-CM | POA: Diagnosis not present

## 2023-04-29 DIAGNOSIS — M25661 Stiffness of right knee, not elsewhere classified: Secondary | ICD-10-CM | POA: Diagnosis not present

## 2023-05-02 DIAGNOSIS — M25661 Stiffness of right knee, not elsewhere classified: Secondary | ICD-10-CM | POA: Diagnosis not present

## 2023-05-02 DIAGNOSIS — M6281 Muscle weakness (generalized): Secondary | ICD-10-CM | POA: Diagnosis not present

## 2023-05-03 DIAGNOSIS — Z4789 Encounter for other orthopedic aftercare: Secondary | ICD-10-CM | POA: Diagnosis not present

## 2023-05-04 DIAGNOSIS — M6281 Muscle weakness (generalized): Secondary | ICD-10-CM | POA: Diagnosis not present

## 2023-05-04 DIAGNOSIS — M25661 Stiffness of right knee, not elsewhere classified: Secondary | ICD-10-CM | POA: Diagnosis not present

## 2023-05-06 DIAGNOSIS — M25661 Stiffness of right knee, not elsewhere classified: Secondary | ICD-10-CM | POA: Diagnosis not present

## 2023-05-06 DIAGNOSIS — M6281 Muscle weakness (generalized): Secondary | ICD-10-CM | POA: Diagnosis not present

## 2023-05-10 DIAGNOSIS — M25661 Stiffness of right knee, not elsewhere classified: Secondary | ICD-10-CM | POA: Diagnosis not present

## 2023-05-10 DIAGNOSIS — M6281 Muscle weakness (generalized): Secondary | ICD-10-CM | POA: Diagnosis not present

## 2023-05-12 DIAGNOSIS — L239 Allergic contact dermatitis, unspecified cause: Secondary | ICD-10-CM | POA: Diagnosis not present

## 2023-05-17 DIAGNOSIS — M25661 Stiffness of right knee, not elsewhere classified: Secondary | ICD-10-CM | POA: Diagnosis not present

## 2023-05-17 DIAGNOSIS — M6281 Muscle weakness (generalized): Secondary | ICD-10-CM | POA: Diagnosis not present

## 2023-05-19 DIAGNOSIS — M6281 Muscle weakness (generalized): Secondary | ICD-10-CM | POA: Diagnosis not present

## 2023-05-19 DIAGNOSIS — M25661 Stiffness of right knee, not elsewhere classified: Secondary | ICD-10-CM | POA: Diagnosis not present

## 2023-05-24 DIAGNOSIS — M6281 Muscle weakness (generalized): Secondary | ICD-10-CM | POA: Diagnosis not present

## 2023-05-24 DIAGNOSIS — M25661 Stiffness of right knee, not elsewhere classified: Secondary | ICD-10-CM | POA: Diagnosis not present

## 2023-05-26 DIAGNOSIS — M25661 Stiffness of right knee, not elsewhere classified: Secondary | ICD-10-CM | POA: Diagnosis not present

## 2023-05-26 DIAGNOSIS — M25561 Pain in right knee: Secondary | ICD-10-CM | POA: Diagnosis not present

## 2023-05-26 DIAGNOSIS — M6281 Muscle weakness (generalized): Secondary | ICD-10-CM | POA: Diagnosis not present

## 2023-05-30 ENCOUNTER — Emergency Department (HOSPITAL_BASED_OUTPATIENT_CLINIC_OR_DEPARTMENT_OTHER)
Admission: EM | Admit: 2023-05-30 | Discharge: 2023-05-30 | Disposition: A | Discharge status: Home / Self Care | Payer: PPO | Attending: Emergency Medicine | Admitting: Emergency Medicine

## 2023-05-30 ENCOUNTER — Encounter (HOSPITAL_BASED_OUTPATIENT_CLINIC_OR_DEPARTMENT_OTHER): Payer: Self-pay

## 2023-05-30 ENCOUNTER — Other Ambulatory Visit: Payer: Self-pay

## 2023-05-30 DIAGNOSIS — Z79899 Other long term (current) drug therapy: Secondary | ICD-10-CM | POA: Diagnosis not present

## 2023-05-30 DIAGNOSIS — T7849XA Other allergy, initial encounter: Secondary | ICD-10-CM | POA: Diagnosis not present

## 2023-05-30 DIAGNOSIS — T3695XA Adverse effect of unspecified systemic antibiotic, initial encounter: Secondary | ICD-10-CM

## 2023-05-30 DIAGNOSIS — T7840XA Allergy, unspecified, initial encounter: Secondary | ICD-10-CM | POA: Diagnosis not present

## 2023-05-30 DIAGNOSIS — I10 Essential (primary) hypertension: Secondary | ICD-10-CM | POA: Diagnosis not present

## 2023-05-30 LAB — CBC WITH DIFFERENTIAL/PLATELET
Abs Immature Granulocytes: 0.04 10*3/uL (ref 0.00–0.07)
Basophils Absolute: 0 10*3/uL (ref 0.0–0.1)
Basophils Relative: 0 %
Eosinophils Absolute: 0.1 10*3/uL (ref 0.0–0.5)
Eosinophils Relative: 1 %
HCT: 38 % (ref 36.0–46.0)
Hemoglobin: 12.7 g/dL (ref 12.0–15.0)
Immature Granulocytes: 0 %
Lymphocytes Relative: 36 %
Lymphs Abs: 4.8 10*3/uL — ABNORMAL HIGH (ref 0.7–4.0)
MCH: 28.7 pg (ref 26.0–34.0)
MCHC: 33.4 g/dL (ref 30.0–36.0)
MCV: 86 fL (ref 80.0–100.0)
Monocytes Absolute: 0.6 10*3/uL (ref 0.1–1.0)
Monocytes Relative: 5 %
Neutro Abs: 7.8 10*3/uL — ABNORMAL HIGH (ref 1.7–7.7)
Neutrophils Relative %: 58 %
Platelets: 348 10*3/uL (ref 150–400)
RBC: 4.42 MIL/uL (ref 3.87–5.11)
RDW: 13.1 % (ref 11.5–15.5)
WBC: 13.4 10*3/uL — ABNORMAL HIGH (ref 4.0–10.5)
nRBC: 0 % (ref 0.0–0.2)

## 2023-05-30 LAB — CBC
HCT: 37.2 % (ref 36.0–46.0)
Hemoglobin: 12.5 g/dL (ref 12.0–15.0)
MCH: 28.9 pg (ref 26.0–34.0)
MCHC: 33.6 g/dL (ref 30.0–36.0)
MCV: 86.1 fL (ref 80.0–100.0)
Platelets: 346 10*3/uL (ref 150–400)
RBC: 4.32 MIL/uL (ref 3.87–5.11)
RDW: 12.9 % (ref 11.5–15.5)
WBC: 13.4 10*3/uL — ABNORMAL HIGH (ref 4.0–10.5)
nRBC: 0 % (ref 0.0–0.2)

## 2023-05-30 LAB — BASIC METABOLIC PANEL
Anion gap: 10 (ref 5–15)
BUN: 23 mg/dL (ref 8–23)
CO2: 27 mmol/L (ref 22–32)
Calcium: 9.4 mg/dL (ref 8.9–10.3)
Chloride: 98 mmol/L (ref 98–111)
Creatinine, Ser: 1.08 mg/dL — ABNORMAL HIGH (ref 0.44–1.00)
GFR, Estimated: 55 mL/min — ABNORMAL LOW (ref >60)
Glucose, Bld: 114 mg/dL — ABNORMAL HIGH (ref 70–99)
Potassium: 3.8 mmol/L (ref 3.5–5.1)
Sodium: 135 mmol/L (ref 135–145)

## 2023-05-30 MED ORDER — EPINEPHRINE 0.3 MG/0.3ML IJ SOAJ
0.3000 mg | Freq: Once | INTRAMUSCULAR | Status: AC
  Administered 2023-05-30: 0.3 mg via INTRAMUSCULAR
  Filled 2023-05-30: qty 0.3

## 2023-05-30 MED ORDER — SODIUM CHLORIDE 0.9 % IV BOLUS
1000.0000 mL | Freq: Once | INTRAVENOUS | Status: AC
  Administered 2023-05-30: 1000 mL via INTRAVENOUS

## 2023-05-30 MED ORDER — EPINEPHRINE 0.3 MG/0.3ML IJ SOAJ: 0.3000 mg | INTRAMUSCULAR | 0 refills | Status: AC | PRN

## 2023-05-30 MED ORDER — PREDNISONE 10 MG (21) PO TBPK: ORAL_TABLET | Freq: Every day | ORAL | 0 refills | Status: AC

## 2023-05-30 MED ORDER — SODIUM CHLORIDE 0.9 % IV SOLN: INTRAVENOUS | Status: DC

## 2023-05-30 MED ORDER — DIPHENHYDRAMINE HCL 50 MG/ML IJ SOLN
25.0000 mg | Freq: Once | INTRAMUSCULAR | Status: AC
  Administered 2023-05-30: 25 mg via INTRAVENOUS
  Filled 2023-05-30: qty 1

## 2023-05-30 MED ORDER — FAMOTIDINE IN NACL 20-0.9 MG/50ML-% IV SOLN
20.0000 mg | Freq: Once | INTRAVENOUS | Status: AC
  Administered 2023-05-30: 20 mg via INTRAVENOUS
  Filled 2023-05-30: qty 50

## 2023-05-30 MED ORDER — METHYLPREDNISOLONE SODIUM SUCC 125 MG IJ SOLR
125.0000 mg | Freq: Once | INTRAMUSCULAR | Status: AC
  Administered 2023-05-30: 125 mg via INTRAVENOUS
  Filled 2023-05-30: qty 2

## 2023-05-30 NOTE — ED Notes (Addendum)
PT family refusing vitals at this time

## 2023-05-30 NOTE — Discharge Instructions (Signed)
You have been seen today for your complaint of allergic reaction. Your discharge medications include epi pen. Use this if you develop signs or symptoms of anaphylaxis.  Prednisone. This is a steroid. Take it as prescribed and for the entire duration of the prescription. Follow up with: your primary care provider in one week for reevaluation. Please seek immediate medical care if you develop any of the following symptoms: You have symptoms of an allergic reaction. You use an auto-injector pen. You will need more medical care even if the medicine seems to be working. An anaphylactic reaction may happen again within 72 hours (rebound anaphylaxis). At this time there does not appear to be the presence of an emergent medical condition, however there is always the potential for conditions to change. Please read and follow the below instructions.  Do not take your medicine if  develop an itchy rash, swelling in your mouth or lips, or difficulty breathing; call 911 and seek immediate emergency medical attention if this occurs.  You may review your lab tests and imaging results in their entirety on your MyChart account.  Please discuss all results of fully with your primary care provider and other specialist at your follow-up visit.  Note: Portions of this text may have been transcribed using voice recognition software. Every effort was made to ensure accuracy; however, inadvertent computerized transcription errors may still be present.

## 2023-05-30 NOTE — ED Triage Notes (Signed)
Patient here POV from Home.  Endorses at 1400 approximately taking 4 Capsules of Amoxicillin. Soon after began to have Rash generalized to Body.   25 mg of Benadryl taken PTA. No SOB but does note "Golf ball in throat".   NAD Noted during triage. A&Ox4. GCS 15. Ambulatory.

## 2023-05-30 NOTE — ED Provider Notes (Signed)
EMERGENCY DEPARTMENT AT [facility] Provider Note   CSN: [id number] Arrival date & time: 05/30/23  1608     History  Chief Complaint  Patient presents with   Allergic Reaction    Hifza Schwark is a 72 y.o. female.  With a history of hypertension, arthritis who presents to the ED for evaluation of allergic reaction.  She states she took 4 amoxicillin tablets at 2 PM just prior to a dental cleaning.  Shortly after taking the medication she noticed she felt a burning in her hands.  By the time she was done with a dental cleaning her hands were red and itching.  She states she has progressively developed a rash in covering both upper extremities, both feet and her entire torso.  She has also developed a "golf ball feeling" in her throat.  She denies any difficulty breathing or swallowing.  She has taken amoxicillin in the past and has never had a reaction before.   Allergic Reaction Presenting symptoms: rash        Home Medications Prior to Admission medications   Medication Sig Start Date End Date Taking? Authorizing Provider  EPINEPHrine 0.3 mg/0.3 mL IJ SOAJ injection Inject 0.3 mg into the muscle as needed for anaphylaxis. 05/30/23  Yes Susi Goslin, Edsel Petrin, PA-C  predniSONE (STERAPRED UNI-PAK 21 TAB) 10 MG (21) TBPK tablet Take by mouth daily. Take 6 tabs by mouth daily  for 2 days, then 5 tabs for 2 days, then 4 tabs for 2 days, then 3 tabs for 2 days, 2 tabs for 2 days, then 1 tab by mouth daily for 2 days 05/30/23  Yes Nahuel Wilbert, Edsel Petrin, PA-C  Cholecalciferol (VITAMIN D3) 2000 units TABS Take by mouth.    [provider]  estradiol (VIVELLE-DOT) 0.1 MG/24HR patch Place 1 patch onto the skin 2 (two) times a week.    [provider]  fexofenadine (ALLEGRA) 180 MG tablet Take 180 mg by mouth daily.    [provider]  HYDROcodone-acetaminophen (NORCO/VICODIN) 5-325 MG tablet Take 1 tablet by mouth every 6 (six) hours as needed. 11/24/21    [provider]  losartan (COZAAR) 25 MG tablet Take 25 mg by mouth daily.    [provider]  methocarbamol (ROBAXIN) 500 MG tablet Take 500 mg by mouth every 6 (six) hours as needed. 11/14/21   [provider]  triamterene-hydrochlorothiazide (MAXZIDE-25) 37.5-25 MG tablet Take 1 tablet by mouth daily.    [provider]      Allergies    Patient has no known allergies.    Review of Systems   Review of Systems  Skin:  Positive for rash.  All other systems reviewed and are negative.   Physical Exam Updated Vital Signs BP (!) 153/59 (BP Location: Right Arm)   Pulse 87   Temp (!) 97.5 F (36.4 C) (Oral)   Resp 20   Ht 5' 1.5" (1.562 m)   Wt 81.6 kg   SpO2 100%   BMI 33.46 kg/m  Physical Exam Vitals and nursing note reviewed.  Constitutional:      General: She is not in acute distress.    Appearance: She is well-developed.     Comments: Resting comfortably in bed  HENT:     Head: Normocephalic and atraumatic.     Mouth/Throat:     Mouth: Mucous membranes are moist.     Pharynx: Oropharynx is clear. No oropharyngeal exudate or posterior oropharyngeal erythema.  Comments: No intraoral swelling.  Uvula midline. Eyes:     Conjunctiva/sclera: Conjunctivae normal.  Cardiovascular:     Rate and Rhythm: Normal rate and regular rhythm.     Heart sounds: No murmur heard. Pulmonary:     Effort: Pulmonary effort is normal. No respiratory distress.     Breath sounds: Normal breath sounds. No stridor. No wheezing, rhonchi or rales.  Abdominal:     Palpations: Abdomen is soft.     Tenderness: There is no abdominal tenderness.  Musculoskeletal:        General: No swelling.     Cervical back: Neck supple.  Skin:    General: Skin is warm and dry.     Capillary Refill: Capillary refill takes less than 2 seconds.     Comments: Blanchable morbilliform rash to the entire torso including bilateral upper extremities. Negative nikolsky sign. No  mucosal involvement.  Neurological:     General: No focal deficit present.     Mental Status: She is alert and oriented to person, place, and time.  Psychiatric:        Mood and Affect: Mood normal.     ED Results / Procedures / Treatments   Labs (all labs ordered are listed, but only abnormal results are displayed) Labs Reviewed  BASIC METABOLIC PANEL - Abnormal; Notable for the following components:      Result Value   Glucose, Bld 114 (*)    Creatinine, Ser 1.08 (*)    GFR, Estimated 55 (*)    All other components within normal limits  CBC - Abnormal; Notable for the following components:   WBC 13.4 (*)    All other components within normal limits  CBC WITH DIFFERENTIAL/PLATELET - Abnormal; Notable for the following components:   WBC 13.4 (*)    Neutro Abs 7.8 (*)    Lymphs Abs 4.8 (*)    All other components within normal limits    EKG None  Radiology No results found.  Procedures Procedures    Medications Ordered in ED Medications  sodium chloride 0.9 % bolus 1,000 mL (0 mLs Intravenous Stopped 05/30/23 1743)    And  0.9 %  sodium chloride infusion (has no administration in time range)  diphenhydrAMINE (BENADRYL) injection 25 mg (25 mg Intravenous Given 05/30/23 1629)  methylPREDNISolone sodium succinate (SOLU-MEDROL) 125 mg/2 mL injection 125 mg (125 mg Intravenous Given 05/30/23 1631)  famotidine (PEPCID) IVPB 20 mg premix (0 mg Intravenous Stopped 05/30/23 1742)  EPINEPHrine (EPI-PEN) injection 0.3 mg (0.3 mg Intramuscular Given 05/30/23 1638)    ED Course/ Medical Decision Making/ A&P Clinical Course as of 05/30/23 1846  Mon May 30, 2023  1706 Symptoms have greatly improved.  Rash has resolved on reevaluation.  Will continue to monitor. [AS]  1709 Stable Treated for anaphylaxis after amoxicillin use.  Interval improvement.  Observation until 2030 [CC]    Clinical Course User Index [AS] Kolyn Rozario, Edsel Petrin, PA-C [CC] Glyn Ade, MD                              Medical Decision Making Amount and/or Complexity of Data Reviewed Labs: ordered.  Risk Prescription drug management.  This patient presents to the ED for concern of allergic reaction, this involves an extensive number of treatment options, and is a complaint that carries with it a high risk of complications and morbidity.  The differential diagnosis includes DRESS, drug eruption, viral exanthem, anaphylaxis.  My  initial workup includes symptom control, labs  Additional history obtained from: Nursing notes from this visit. Family husband is at bedside and provides a portion of the history  I ordered, reviewed and interpreted labs which include: CBC, BMP.  Cardiac Monitoring:  The patient was maintained on a cardiac monitor.  I personally viewed and interpreted the cardiac monitored which showed an underlying rhythm of: NSR  Afebrile, hemodynamically stable.  72 year old female presenting to the ED for evaluation of an allergic reaction after taking amoxicillin earlier today.  She did have a morbilliform rash as well as a "golf ball knot" in her throat.  She has taken amoxicillin before without any difficulty.  Patient was treated for anaphylaxis in the ED and reported significant improvement in her symptoms.  She will be monitored for a total of 4 hours post epinephrine injection.  Care will be handed off to Dr. Doran Durand pending monitoring period. If her symptoms have remained stable, she will be safe to discharge home. Prescriptions for epi pen and sterapred were sent. She was encouraged to follow up with her PCP in one week for reevaluation.   Patient's case discussed with Dr. Doran Durand who agrees with plan to discharge with follow-up after monitoring period.   Note: Portions of this report may have been transcribed using voice recognition software. Every effort was made to ensure accuracy; however, inadvertent computerized transcription errors may still be  present.        Final Clinical Impression(s) / ED Diagnoses Final diagnoses:  Allergic reaction due to antibacterial drug    Rx / DC Orders ED Discharge Orders          Ordered    EPINEPHrine 0.3 mg/0.3 mL IJ SOAJ injection  As needed        05/30/23 1839    predniSONE (STERAPRED UNI-PAK 21 TAB) 10 MG (21) TBPK tablet  Daily        05/30/23 1839              Mora Bellman 05/30/23 1846    Glyn Ade, MD 05/31/23 1458

## 2023-05-30 NOTE — ED Notes (Signed)
Pt no longer has rash. Voice clear, no longer reporting lump in throat

## 2023-05-30 NOTE — ED Notes (Signed)
RN reviewed discharge instructions with pt. Pt verbalized understanding and had no further questions. VSS upon discharge.  

## 2023-05-30 NOTE — ED Notes (Signed)
Pts generalized rash significantly improved. Alexander PA made aware

## 2023-06-08 DIAGNOSIS — Z1231 Encounter for screening mammogram for malignant neoplasm of breast: Secondary | ICD-10-CM | POA: Diagnosis not present

## 2023-06-13 ENCOUNTER — Other Ambulatory Visit: Payer: Self-pay | Admitting: Obstetrics and Gynecology

## 2023-06-13 DIAGNOSIS — R928 Other abnormal and inconclusive findings on diagnostic imaging of breast: Secondary | ICD-10-CM

## 2023-06-16 DIAGNOSIS — M25561 Pain in right knee: Secondary | ICD-10-CM | POA: Diagnosis not present

## 2023-06-16 DIAGNOSIS — M6281 Muscle weakness (generalized): Secondary | ICD-10-CM | POA: Diagnosis not present

## 2023-06-16 DIAGNOSIS — M25661 Stiffness of right knee, not elsewhere classified: Secondary | ICD-10-CM | POA: Diagnosis not present

## 2023-06-17 ENCOUNTER — Ambulatory Visit
Admission: RE | Admit: 2023-06-17 | Discharge: 2023-06-17 | Disposition: A | Discharge status: Home / Self Care | Payer: PPO | Source: Ambulatory Visit | Attending: Obstetrics and Gynecology | Admitting: Obstetrics and Gynecology

## 2023-06-17 ENCOUNTER — Ambulatory Visit: Payer: PPO

## 2023-06-17 DIAGNOSIS — R928 Other abnormal and inconclusive findings on diagnostic imaging of breast: Secondary | ICD-10-CM

## 2023-10-14 DIAGNOSIS — R7303 Prediabetes: Secondary | ICD-10-CM | POA: Diagnosis not present

## 2023-10-14 DIAGNOSIS — N183 Chronic kidney disease, stage 3 unspecified: Secondary | ICD-10-CM | POA: Diagnosis not present

## 2023-10-14 DIAGNOSIS — E785 Hyperlipidemia, unspecified: Secondary | ICD-10-CM | POA: Diagnosis not present

## 2023-10-14 DIAGNOSIS — I129 Hypertensive chronic kidney disease with stage 1 through stage 4 chronic kidney disease, or unspecified chronic kidney disease: Secondary | ICD-10-CM | POA: Diagnosis not present

## 2023-10-14 DIAGNOSIS — D72820 Lymphocytosis (symptomatic): Secondary | ICD-10-CM | POA: Diagnosis not present

## 2023-10-20 DIAGNOSIS — Z0000 Encounter for general adult medical examination without abnormal findings: Secondary | ICD-10-CM | POA: Diagnosis not present

## 2023-10-20 DIAGNOSIS — R7303 Prediabetes: Secondary | ICD-10-CM | POA: Diagnosis not present

## 2023-10-20 DIAGNOSIS — M8588 Other specified disorders of bone density and structure, other site: Secondary | ICD-10-CM | POA: Diagnosis not present

## 2023-10-20 DIAGNOSIS — D72820 Lymphocytosis (symptomatic): Secondary | ICD-10-CM | POA: Diagnosis not present

## 2023-10-20 DIAGNOSIS — H8109 Meniere's disease, unspecified ear: Secondary | ICD-10-CM | POA: Diagnosis not present

## 2023-10-20 DIAGNOSIS — F17200 Nicotine dependence, unspecified, uncomplicated: Secondary | ICD-10-CM | POA: Diagnosis not present

## 2023-10-20 DIAGNOSIS — I129 Hypertensive chronic kidney disease with stage 1 through stage 4 chronic kidney disease, or unspecified chronic kidney disease: Secondary | ICD-10-CM | POA: Diagnosis not present

## 2023-10-20 DIAGNOSIS — E785 Hyperlipidemia, unspecified: Secondary | ICD-10-CM | POA: Diagnosis not present

## 2023-10-20 DIAGNOSIS — N183 Chronic kidney disease, stage 3 unspecified: Secondary | ICD-10-CM | POA: Diagnosis not present

## 2023-10-21 ENCOUNTER — Other Ambulatory Visit: Payer: Self-pay | Admitting: Family Medicine

## 2023-10-21 DIAGNOSIS — M8588 Other specified disorders of bone density and structure, other site: Secondary | ICD-10-CM

## 2024-01-30 DIAGNOSIS — H00011 Hordeolum externum right upper eyelid: Secondary | ICD-10-CM | POA: Diagnosis not present

## 2024-04-19 DIAGNOSIS — N183 Chronic kidney disease, stage 3 unspecified: Secondary | ICD-10-CM | POA: Diagnosis not present

## 2024-04-19 DIAGNOSIS — H8109 Meniere's disease, unspecified ear: Secondary | ICD-10-CM | POA: Diagnosis not present

## 2024-04-19 DIAGNOSIS — I129 Hypertensive chronic kidney disease with stage 1 through stage 4 chronic kidney disease, or unspecified chronic kidney disease: Secondary | ICD-10-CM | POA: Diagnosis not present

## 2024-04-19 DIAGNOSIS — F17200 Nicotine dependence, unspecified, uncomplicated: Secondary | ICD-10-CM | POA: Diagnosis not present

## 2024-05-31 DIAGNOSIS — H47323 Drusen of optic disc, bilateral: Secondary | ICD-10-CM | POA: Diagnosis not present

## 2024-05-31 DIAGNOSIS — H353131 Nonexudative age-related macular degeneration, bilateral, early dry stage: Secondary | ICD-10-CM | POA: Diagnosis not present

## 2024-05-31 DIAGNOSIS — H40053 Ocular hypertension, bilateral: Secondary | ICD-10-CM | POA: Diagnosis not present

## 2024-05-31 DIAGNOSIS — H40013 Open angle with borderline findings, low risk, bilateral: Secondary | ICD-10-CM | POA: Diagnosis not present

## 2024-06-11 DIAGNOSIS — Z01419 Encounter for gynecological examination (general) (routine) without abnormal findings: Secondary | ICD-10-CM | POA: Diagnosis not present

## 2024-06-11 DIAGNOSIS — Z1231 Encounter for screening mammogram for malignant neoplasm of breast: Secondary | ICD-10-CM | POA: Diagnosis not present

## 2024-07-26 ENCOUNTER — Other Ambulatory Visit: Payer: PPO

## 2024-08-16 DIAGNOSIS — M7542 Impingement syndrome of left shoulder: Secondary | ICD-10-CM | POA: Diagnosis not present

## 2024-08-21 DIAGNOSIS — H2512 Age-related nuclear cataract, left eye: Secondary | ICD-10-CM | POA: Diagnosis not present

## 2024-08-21 DIAGNOSIS — H25043 Posterior subcapsular polar age-related cataract, bilateral: Secondary | ICD-10-CM | POA: Diagnosis not present

## 2024-08-21 DIAGNOSIS — H2513 Age-related nuclear cataract, bilateral: Secondary | ICD-10-CM | POA: Diagnosis not present

## 2024-08-21 DIAGNOSIS — H18413 Arcus senilis, bilateral: Secondary | ICD-10-CM | POA: Diagnosis not present

## 2024-08-21 DIAGNOSIS — H353132 Nonexudative age-related macular degeneration, bilateral, intermediate dry stage: Secondary | ICD-10-CM | POA: Diagnosis not present

## 2024-09-17 NOTE — Telephone Encounter (Signed)
 error

## 2024-09-20 DIAGNOSIS — F17200 Nicotine dependence, unspecified, uncomplicated: Secondary | ICD-10-CM | POA: Diagnosis not present

## 2024-09-20 DIAGNOSIS — R600 Localized edema: Secondary | ICD-10-CM | POA: Diagnosis not present

## 2024-09-20 DIAGNOSIS — R0981 Nasal congestion: Secondary | ICD-10-CM | POA: Diagnosis not present

## 2024-09-20 DIAGNOSIS — H9201 Otalgia, right ear: Secondary | ICD-10-CM | POA: Diagnosis not present

## 2024-09-20 DIAGNOSIS — I129 Hypertensive chronic kidney disease with stage 1 through stage 4 chronic kidney disease, or unspecified chronic kidney disease: Secondary | ICD-10-CM | POA: Diagnosis not present

## 2024-09-20 DIAGNOSIS — M8588 Other specified disorders of bone density and structure, other site: Secondary | ICD-10-CM | POA: Diagnosis not present

## 2024-09-23 ENCOUNTER — Encounter (HOSPITAL_BASED_OUTPATIENT_CLINIC_OR_DEPARTMENT_OTHER): Payer: Self-pay

## 2024-09-23 ENCOUNTER — Other Ambulatory Visit (HOSPITAL_BASED_OUTPATIENT_CLINIC_OR_DEPARTMENT_OTHER): Payer: Self-pay | Admitting: Family Medicine

## 2024-09-23 DIAGNOSIS — M8580 Other specified disorders of bone density and structure, unspecified site: Secondary | ICD-10-CM

## 2024-10-22 DIAGNOSIS — D72820 Lymphocytosis (symptomatic): Secondary | ICD-10-CM | POA: Diagnosis not present

## 2024-10-22 DIAGNOSIS — E785 Hyperlipidemia, unspecified: Secondary | ICD-10-CM | POA: Diagnosis not present

## 2024-10-22 DIAGNOSIS — M8588 Other specified disorders of bone density and structure, other site: Secondary | ICD-10-CM | POA: Diagnosis not present

## 2024-10-22 DIAGNOSIS — I129 Hypertensive chronic kidney disease with stage 1 through stage 4 chronic kidney disease, or unspecified chronic kidney disease: Secondary | ICD-10-CM | POA: Diagnosis not present

## 2024-10-22 DIAGNOSIS — N183 Chronic kidney disease, stage 3 unspecified: Secondary | ICD-10-CM | POA: Diagnosis not present

## 2024-10-22 DIAGNOSIS — Z1331 Encounter for screening for depression: Secondary | ICD-10-CM | POA: Diagnosis not present

## 2024-10-22 DIAGNOSIS — F17200 Nicotine dependence, unspecified, uncomplicated: Secondary | ICD-10-CM | POA: Diagnosis not present

## 2024-10-22 DIAGNOSIS — H8109 Meniere's disease, unspecified ear: Secondary | ICD-10-CM | POA: Diagnosis not present

## 2024-10-22 DIAGNOSIS — R7303 Prediabetes: Secondary | ICD-10-CM | POA: Diagnosis not present

## 2024-10-22 DIAGNOSIS — Z0000 Encounter for general adult medical examination without abnormal findings: Secondary | ICD-10-CM | POA: Diagnosis not present

## 2024-10-23 ENCOUNTER — Other Ambulatory Visit (HOSPITAL_BASED_OUTPATIENT_CLINIC_OR_DEPARTMENT_OTHER): Payer: Self-pay | Admitting: Family Medicine

## 2024-10-23 DIAGNOSIS — I129 Hypertensive chronic kidney disease with stage 1 through stage 4 chronic kidney disease, or unspecified chronic kidney disease: Secondary | ICD-10-CM

## 2024-10-26 DIAGNOSIS — H2512 Age-related nuclear cataract, left eye: Secondary | ICD-10-CM | POA: Diagnosis not present

## 2024-10-26 DIAGNOSIS — H5371 Glare sensitivity: Secondary | ICD-10-CM | POA: Diagnosis not present

## 2024-10-26 DIAGNOSIS — H2511 Age-related nuclear cataract, right eye: Secondary | ICD-10-CM | POA: Diagnosis not present

## 2024-11-09 DIAGNOSIS — H2511 Age-related nuclear cataract, right eye: Secondary | ICD-10-CM | POA: Diagnosis not present

## 2024-11-09 DIAGNOSIS — H25041 Posterior subcapsular polar age-related cataract, right eye: Secondary | ICD-10-CM | POA: Diagnosis not present

## 2024-11-09 DIAGNOSIS — H5371 Glare sensitivity: Secondary | ICD-10-CM | POA: Diagnosis not present

## 2024-11-09 DIAGNOSIS — H25011 Cortical age-related cataract, right eye: Secondary | ICD-10-CM | POA: Diagnosis not present
